# Patient Record
Sex: Female | Born: 1965 | Race: White | Hispanic: No | Marital: Married | Smoking: Never smoker
Health system: Southern US, Community
[De-identification: ages and names within clinical notes are randomized; demographics above are authoritative.]

---

## 2020-04-17 DIAGNOSIS — Z6830 Body mass index (BMI) 30.0-30.9, adult: Secondary | ICD-10-CM | POA: Diagnosis not present

## 2020-04-17 DIAGNOSIS — Z01419 Encounter for gynecological examination (general) (routine) without abnormal findings: Secondary | ICD-10-CM | POA: Diagnosis not present

## 2020-04-30 DIAGNOSIS — Z1231 Encounter for screening mammogram for malignant neoplasm of breast: Secondary | ICD-10-CM | POA: Diagnosis not present

## 2020-04-30 DIAGNOSIS — Z13820 Encounter for screening for osteoporosis: Secondary | ICD-10-CM | POA: Diagnosis not present

## 2020-05-05 ENCOUNTER — Ambulatory Visit
Admission: RE | Admit: 2020-05-05 | Discharge: 2020-05-05 | Disposition: A | Discharge status: Home / Self Care | Payer: BC Managed Care – PPO | Source: Ambulatory Visit | Attending: Emergency Medicine | Admitting: Emergency Medicine

## 2020-05-05 ENCOUNTER — Other Ambulatory Visit: Payer: Self-pay

## 2020-05-05 VITALS — BP 130/85 | HR 76 | Temp 98.9°F | Resp 18

## 2020-05-05 DIAGNOSIS — S61012A Laceration without foreign body of left thumb without damage to nail, initial encounter: Secondary | ICD-10-CM

## 2020-05-05 MED ORDER — CEPHALEXIN 500 MG PO CAPS: 500.0000 mg | ORAL_CAPSULE | Freq: Four times a day (QID) | ORAL | 0 refills | Status: DC

## 2020-05-05 NOTE — ED Triage Notes (Signed)
Pt presents with complaints of laceration to left thumb. States she was trying to get something out of her dogs bone with a knife and cut her finger. Bleeding controlled. Patient has piece of cotton stuck in the laceration. This RN got a large portion out running the finger under cold water. There is a small piece left that the patient cannot tolerate this RN trying to remove. Laceration occurred on Sunday.

## 2020-05-05 NOTE — ED Provider Notes (Signed)
[facility]   [id number] 05/05/20 Arrival Time: 1752  CC: LACERATION  SUBJECTIVE:  Julia Macias is a 54 y.o. female who presented to the urgent care for complaint of laceration to left tongue that occur 3 days ago.  Reports she cut her finger with a knife.  Bleeding controlled.  Currently not on blood thinners.  Denies similar symptoms in the past.  Denies fever, chills, nausea, vomiting, redness, swelling, purulent drainage, decrease strength or sensation.   Td UTD: Yes.  ROS: As per HPI.  All other pertinent ROS negative.     History reviewed. No pertinent past medical history. History reviewed. No pertinent surgical history. No Known Allergies No current facility-administered medications on file prior to encounter.   No current outpatient medications on file prior to encounter.   Social History   Socioeconomic History  . Marital status: Single    Spouse name: Not on file  . Number of children: Not on file  . Years of education: Not on file  . Highest education level: Not on file  Occupational History  . Not on file  Tobacco Use  . Smoking status: Never Smoker  . Smokeless tobacco: Never Used  Substance and Sexual Activity  . Alcohol use: Yes    Comment: occ  . Drug use: Not on file  . Sexual activity: Not on file  Other Topics Concern  . Not on file  Social History Narrative  . Not on file   Social Determinants of Health   Financial Resource Strain:   . Difficulty of Paying Living Expenses: Not on file  Food Insecurity:   . Worried About Programme researcher, broadcasting/film/video in the Last Year: Not on file  . Ran Out of Food in the Last Year: Not on file  Transportation Needs:   . Lack of Transportation (Medical): Not on file  . Lack of Transportation (Non-Medical): Not on file  Physical Activity:   . Days of Exercise per Week: Not on file  . Minutes of Exercise per Session: Not on file  Stress:   . Feeling of Stress : Not on file  Social Connections:   .  Frequency of Communication with Friends and Family: Not on file  . Frequency of Social Gatherings with Friends and Family: Not on file  . Attends Religious Services: Not on file  . Active Member of Clubs or Organizations: Not on file  . Attends Banker Meetings: Not on file  . Marital Status: Not on file  Intimate Partner Violence:   . Fear of Current or Ex-Partner: Not on file  . Emotionally Abused: Not on file  . Physically Abused: Not on file  . Sexually Abused: Not on file   Family History  Problem Relation Age of Onset  . Healthy Mother   . Healthy Father      OBJECTIVE:  Vitals:   05/05/20 1830  BP: 130/85  Pulse: 76  Resp: 18  Temp: 98.9 F (37.2 C)  SpO2: 98%     General appearance: alert; no distress Chest: CTA heart sounds normal Heart: RRR, no murmur, gallop or rub Skin: laceration of left thumb; size: approx U-shaped at the tip of left thumb Psychological: alert and cooperative; normal mood and affect   No results found for this or any previous visit.  Labs Reviewed - No data to display  No results found.   ASSESSMENT & PLAN:  1. Laceration of left thumb without damage to nail, foreign body presence unspecified, initial  encounter     Meds ordered this encounter  Medications  . cephALEXin (KEFLEX) 500 MG capsule    Sig: Take 1 capsule (500 mg total) by mouth 4 (four) times daily.    Dispense:  20 capsule    Refill:  0   Discharge Instructions  Clean with warm water and mild soap.  Avoid submerging wound in water. Take antibiotics prescribed and to completion Change dressing daily and apply a thin layer of neosporin. .   Take OTC ibuprofen or tylenol as needed for pain releif Return sooner or go to the ED if you have any new or worsening symptoms such as increased pain, redness, swelling, drainage, discharge, decreased range of motion of extremity, etc..     Reviewed expectations re: course of current medical issues. Questions  answered. Outlined signs and symptoms indicating need for more acute intervention. Patient verbalized understanding. After Visit Summary given.   Note: This document was prepared using Dragon voice recognition software and may include unintentional dictation errors.    Durward Parcel, FNP 05/05/20 1906

## 2020-05-05 NOTE — Discharge Instructions (Signed)
°  Clean with warm water and mild soap.  Avoid submerging wound in water. Take antibiotics prescribed and to completion Change dressing daily and apply a thin layer of neosporin. .   Take OTC ibuprofen or tylenol as needed for pain releif Return sooner or go to the ED if you have any new or worsening symptoms such as increased pain, redness, swelling, drainage, discharge, decreased range of motion of extremity, etc..

## 2020-05-25 ENCOUNTER — Other Ambulatory Visit: Payer: Self-pay | Admitting: Obstetrics and Gynecology

## 2020-05-25 DIAGNOSIS — Z9189 Other specified personal risk factors, not elsewhere classified: Secondary | ICD-10-CM

## 2020-06-10 DIAGNOSIS — L718 Other rosacea: Secondary | ICD-10-CM | POA: Diagnosis not present

## 2020-06-10 DIAGNOSIS — D1801 Hemangioma of skin and subcutaneous tissue: Secondary | ICD-10-CM | POA: Diagnosis not present

## 2020-06-10 DIAGNOSIS — L821 Other seborrheic keratosis: Secondary | ICD-10-CM | POA: Diagnosis not present

## 2020-06-10 DIAGNOSIS — L814 Other melanin hyperpigmentation: Secondary | ICD-10-CM | POA: Diagnosis not present

## 2020-06-17 ENCOUNTER — Other Ambulatory Visit: Payer: Self-pay

## 2020-06-17 ENCOUNTER — Ambulatory Visit
Admission: RE | Admit: 2020-06-17 | Discharge: 2020-06-17 | Disposition: A | Discharge status: Home / Self Care | Payer: BC Managed Care – PPO | Source: Ambulatory Visit | Attending: Obstetrics and Gynecology | Admitting: Obstetrics and Gynecology

## 2020-06-17 DIAGNOSIS — N6489 Other specified disorders of breast: Secondary | ICD-10-CM | POA: Diagnosis not present

## 2020-06-17 DIAGNOSIS — Z9189 Other specified personal risk factors, not elsewhere classified: Secondary | ICD-10-CM

## 2020-06-17 IMAGING — MR MR BREAST BILAT WO/W CM
8 of 12 series · 33 of 48 positions shown · IV contrast (9 ml gadavist)
Comparison: [DATE] mammogram and prior studies

CLINICAL DATA: 53-year-old female with strong family history of
breast cancer for screening breast MRI.

LABS:  None performed today
EXAM:
BILATERAL BREAST MRI WITH AND WITHOUT CONTRAST
TECHNIQUE: Multiplanar, multisequence MR images of both breasts were obtained
prior to and following the intravenous administration of 9 ml of
Gadavist

[Series 2: t2_tirm_tra ipat (a-p) · axial · 3.0mm · 0.70mm/px · 1 of 55 slices shown]
[im 1/55]
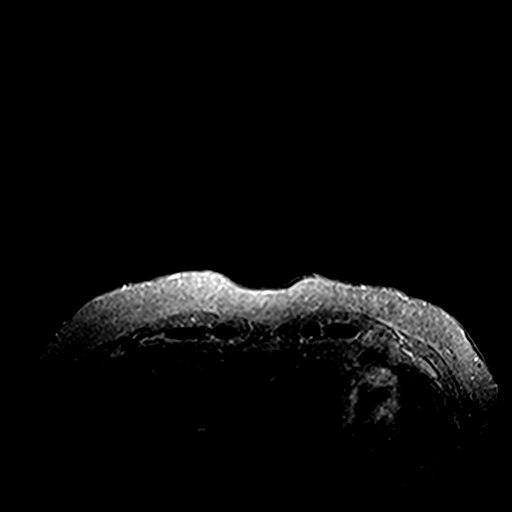

[Series 3: fl3d pre-cm no · axial · non-contrast · 1.2mm · 0.94mm/px · z∈[-59,+113]mm · 5 of 144 slices shown]
[im 1/144]
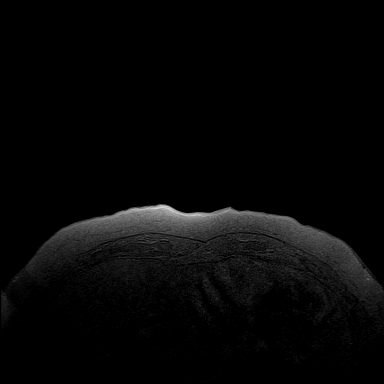
[im 36/144]
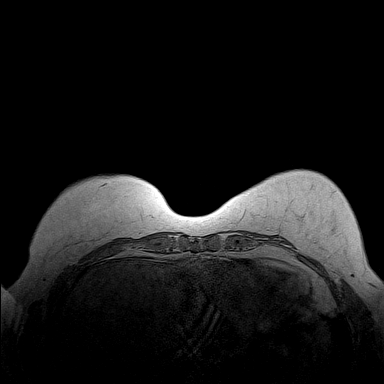
[im 72/144]
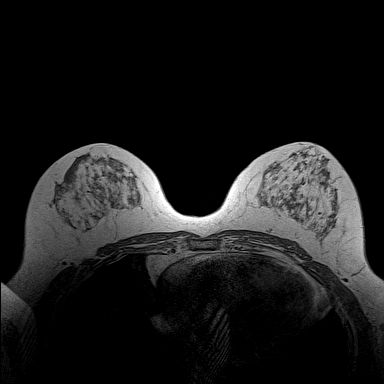
[im 108/144]
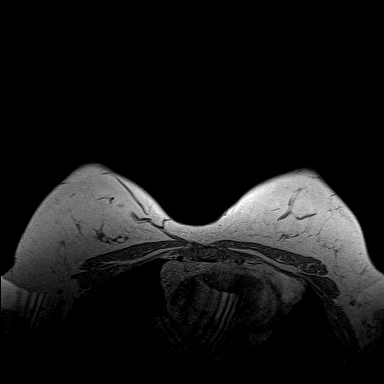
[im 144/144]
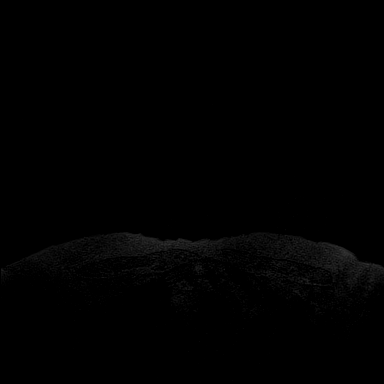

[Series 4: fl3d pre-cm · axial · non-contrast · 1.2mm · 0.94mm/px · z∈[-59,+113]mm · 5 of 144 slices shown]
[im 1/144]
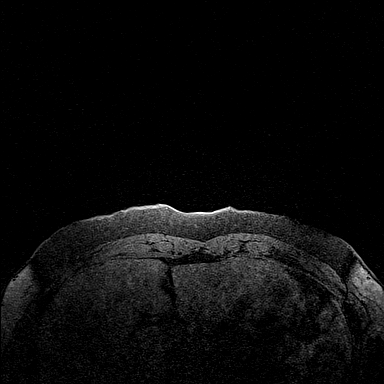
[im 36/144]
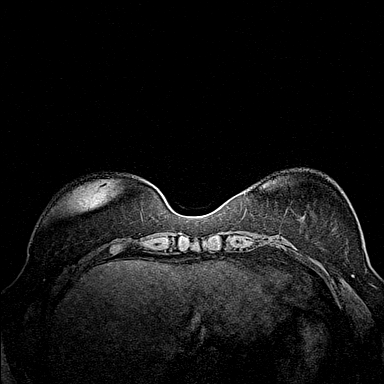
[im 72/144]
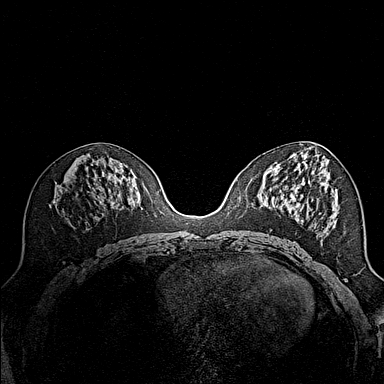
[im 108/144]
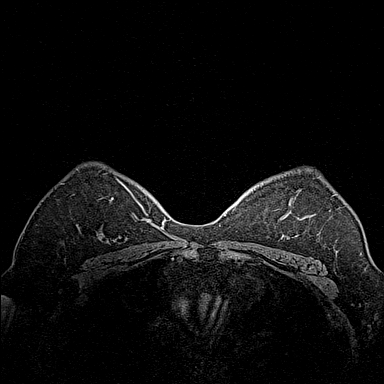
[im 144/144]
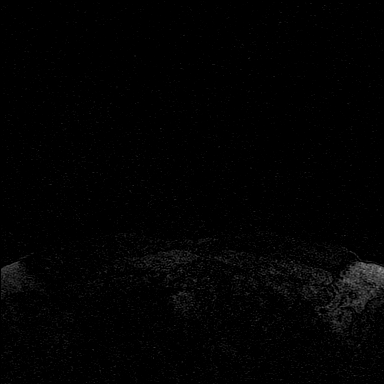

[Series 5: fl3d post-cm 20 · axial · 1.2mm · 0.94mm/px · z∈[-59,+113]mm · 5 of 144 slices shown (1 of 3)]
[im 1/144]
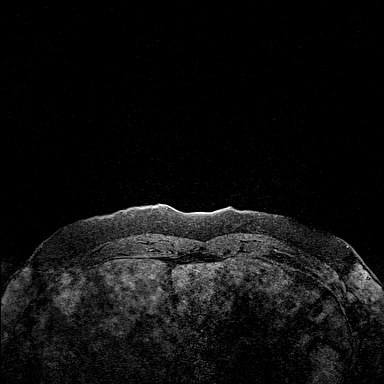
[im 36/144]
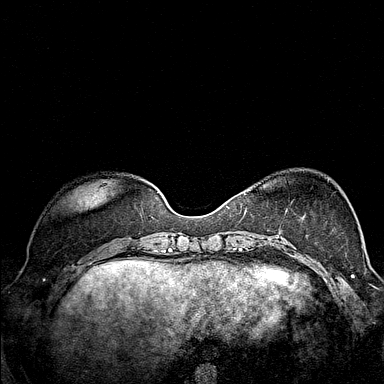
[im 72/144]
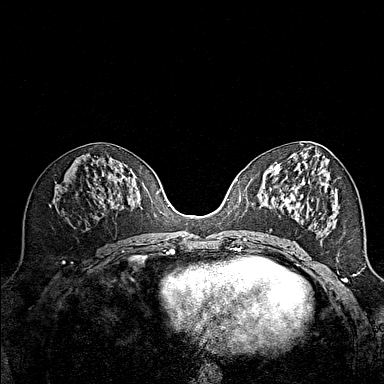
[im 108/144]
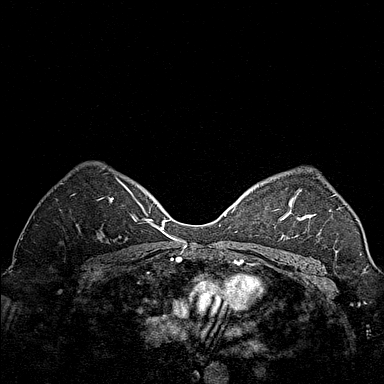
[im 144/144]
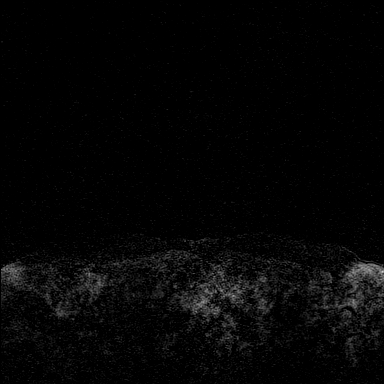

[Series 6: fl3d post-cm 20 · axial · 1.2mm · 0.94mm/px · z∈[-59,+113]mm · 5 of 144 slices shown (2 of 3)]
[im 1/144]
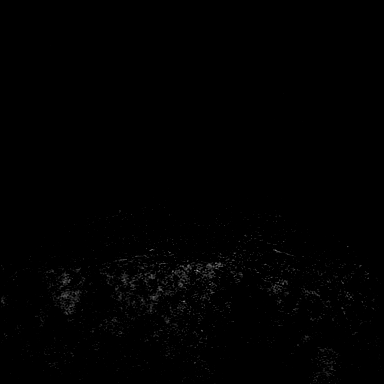
[im 36/144]
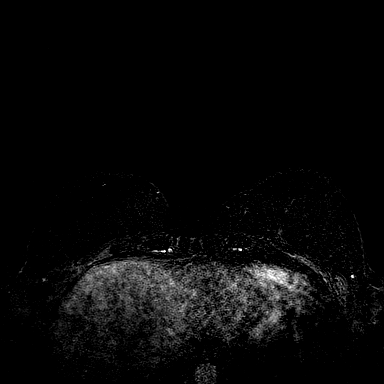
[im 72/144]
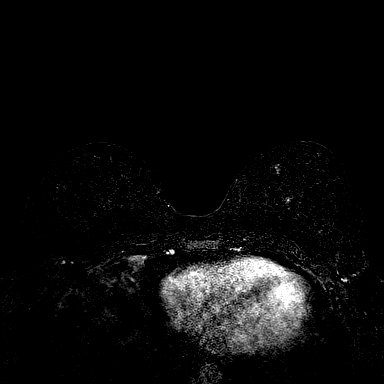
[im 108/144]
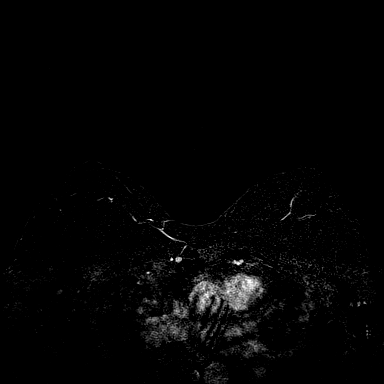
[im 144/144]
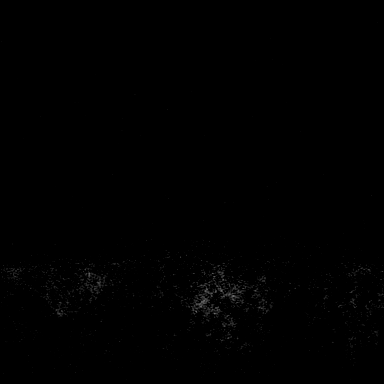

[Series 7: fl3d post-cm 20 · axial · 172.8mm · 0.94mm/px · 1 of 1 slices shown (3 of 3)]
[im 1/1]
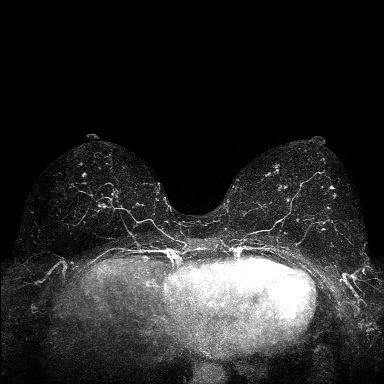

[Series 8: fl3d post-cm 3min · axial · 1.2mm · 0.94mm/px · z∈[-59,+113]mm · 6 of 144 slices shown]
[im 1/144]
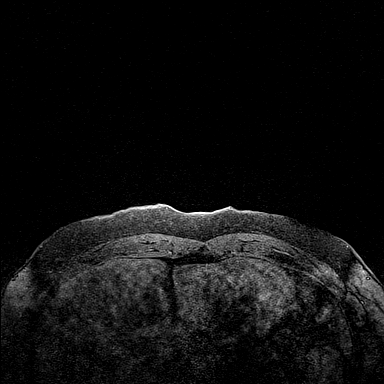
[im 29/144]
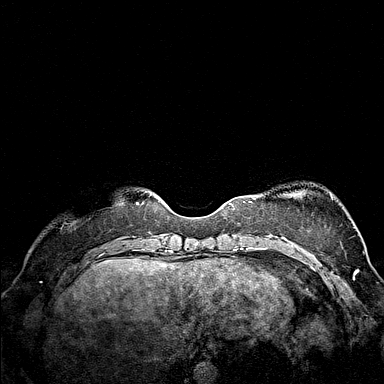
[im 58/144]
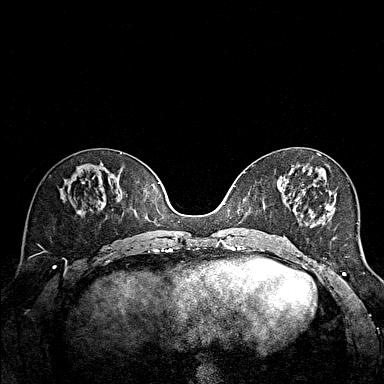
[im 86/144]
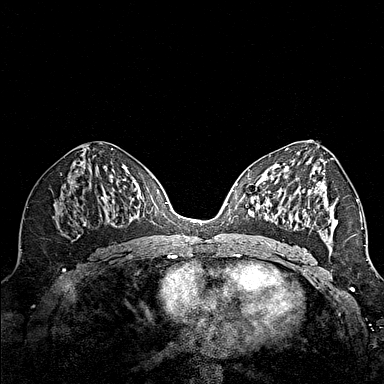
[im 115/144]
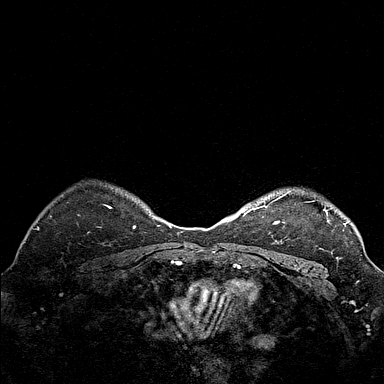
[im 144/144]
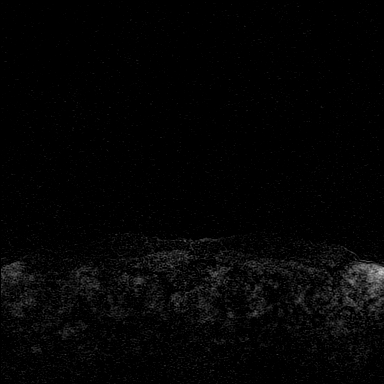

[Series 9: fl3d post-cm 3min_sub · axial · 1.2mm · 0.94mm/px · z∈[-59,+78]mm · 5 of 144 slices shown]
[im 1/144]
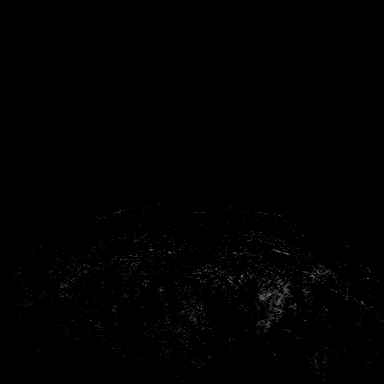
[im 29/144]
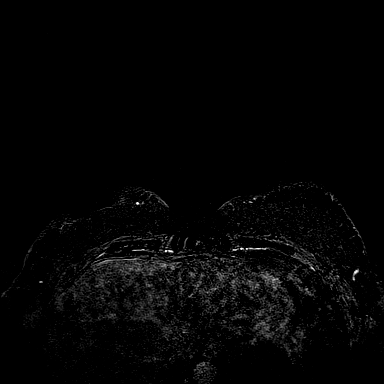
[im 58/144]
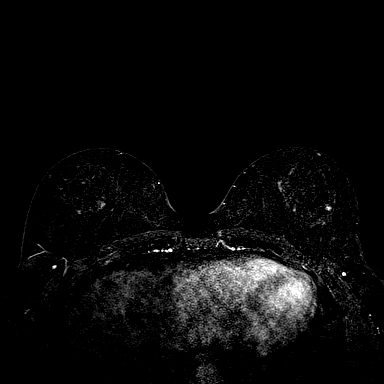
[im 86/144]
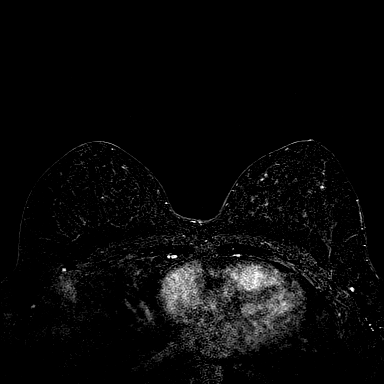
[im 115/144]
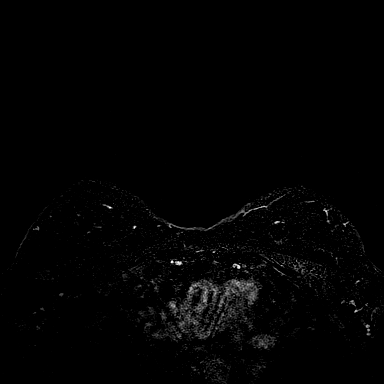

[33 of 48 positions shown; findings below may reference images not displayed]

Three-dimensional MR images were rendered by post-processing of the
original MR data on an independent workstation. The
three-dimensional MR images were interpreted, and findings are
reported in the following complete MRI report for this study. Three
dimensional images were evaluated at the independent interpreting
workstation using the DynaCAD thin client.
FINDINGS: Breast composition: c. Heterogeneous fibroglandular tissue.

Background parenchymal enhancement: Mild

Right breast: No mass or abnormal enhancement. Scattered similar
appearing foci within the RIGHT breast noted.

Left breast: No mass or abnormal enhancement. Scattered similar
appearing foci within the LEFT breast noted.

Lymph nodes: No abnormal appearing lymph nodes.

Ancillary findings:  None.
IMPRESSION: No MR evidence of breast malignancy.

RECOMMENDATION:
Bilateral screening breast MRI in 1 year as clinically indicated.

Bilateral screening mammogram in [DATE] to resume annual
mammogram schedule.

BI-RADS CATEGORY  1: Negative.

## 2020-06-17 MED ORDER — GADOBUTROL 1 MMOL/ML IV SOLN
9.0000 mL | Freq: Once | INTRAVENOUS | Status: AC | PRN
  Administered 2020-06-17: 9 mL via INTRAVENOUS

## 2020-09-15 ENCOUNTER — Other Ambulatory Visit: Payer: Self-pay | Admitting: Obstetrics and Gynecology

## 2020-09-15 DIAGNOSIS — N6320 Unspecified lump in the left breast, unspecified quadrant: Secondary | ICD-10-CM

## 2020-10-02 ENCOUNTER — Ambulatory Visit
Admission: RE | Admit: 2020-10-02 | Discharge: 2020-10-02 | Disposition: A | Discharge status: Home / Self Care | Payer: BC Managed Care – PPO | Source: Ambulatory Visit | Attending: Obstetrics and Gynecology | Admitting: Obstetrics and Gynecology

## 2020-10-02 ENCOUNTER — Other Ambulatory Visit: Payer: Self-pay

## 2020-10-02 DIAGNOSIS — N6320 Unspecified lump in the left breast, unspecified quadrant: Secondary | ICD-10-CM

## 2020-10-02 DIAGNOSIS — R922 Inconclusive mammogram: Secondary | ICD-10-CM | POA: Diagnosis not present

## 2020-10-02 DIAGNOSIS — N6489 Other specified disorders of breast: Secondary | ICD-10-CM | POA: Diagnosis not present

## 2020-10-02 IMAGING — US US BREAST*L* LIMITED INC AXILLA
1 series · 2 of 2 positions shown · non-contrast
Comparison: Previous exam(s).

CLINICAL DATA: 54-year-old with a palpable lump and discoloration
involving the LOWER LEFT breast which was initially noted a couple
of months ago, though resolved approximately 2 weeks ago. There is
no persistent palpable concern at this time.

EXAM:
DIGITAL DIAGNOSTIC LEFT MAMMOGRAM WITH CAD AND TOMOSYNTHESIS
ULTRASOUND LEFT BREAST
TECHNIQUE: LEFT digital diagnostic mammography and breast tomosynthesis was
performed. Digital images of the breast were evaluated with
computer-aided detection. Targeted ultrasound examination of the
LEFT breast was performed.

[Series 1: us breast*left* limited inc axilla · 0.07mm/px · 2 of 2 slices shown]
[im 1/2]
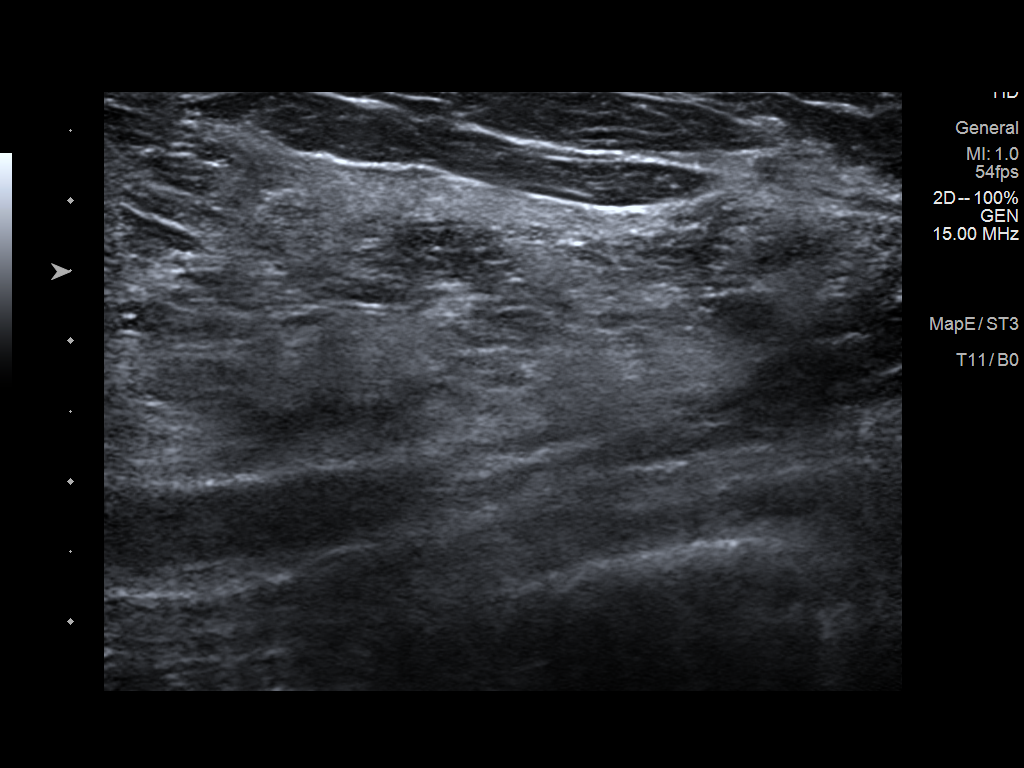
[im 2/2]
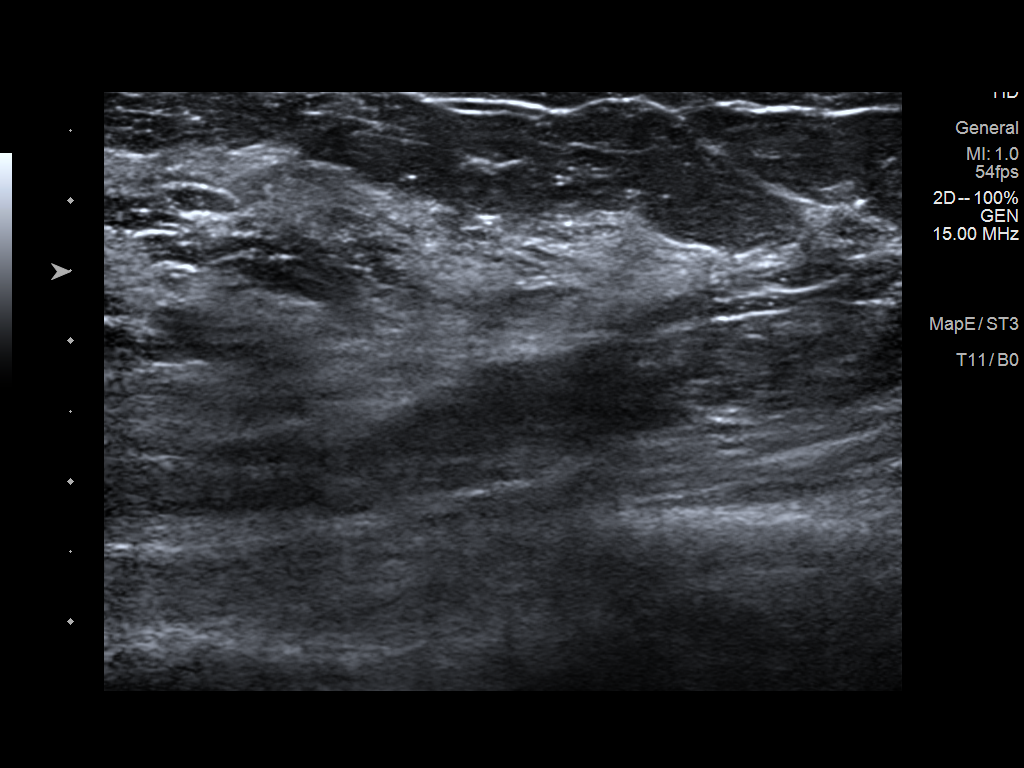

[2 of 2 positions shown; findings below may reference images not displayed]

ACR Breast Density Category c: The breast tissue is heterogeneously
dense, which may obscure small masses.
FINDINGS: Tomosynthesis and synthesized full field CC and MLO views and a
tomosynthesis and synthesized spot compression tangential view of
the area of concern were obtained.

No mammographic abnormality in the area of concern in the LOWER
breast. Normal dense fibroglandular tissue is present in this
location.

No findings suspicious for malignancy in either breast.

Targeted ultrasound is performed, showing normal dense
fibroglandular tissue at the 5-6 o'clock position approximately 3 cm
from the nipple in the area of prior palpable concern and skin
discoloration. No cyst, solid mass or abnormal acoustic shadowing is
identified.

On correlative physical exam, there is no palpable abnormality in
the LOWER LEFT breast in the area of prior concern and there is no
persistent skin discoloration.
IMPRESSION: No mammographic or sonographic evidence of malignancy involving the
LEFT breast.

RECOMMENDATION:
Annual BILATERAL screening mammography which is due in [DATE].

I have discussed the findings and recommendations with the patient.
If applicable, a reminder letter will be sent to the patient
regarding the next appointment.

BI-RADS CATEGORY  1: Negative.

## 2020-10-02 IMAGING — MG MM DIGITAL DIAGNOSTIC UNILAT*L* W/ TOMO W/ CAD
6 series · 6 of 18 positions shown · non-contrast
Comparison: Previous exam(s).

CLINICAL DATA: 54-year-old with a palpable lump and discoloration
involving the LOWER LEFT breast which was initially noted a couple
of months ago, though resolved approximately 2 weeks ago. There is
no persistent palpable concern at this time.

EXAM:
DIGITAL DIAGNOSTIC LEFT MAMMOGRAM WITH CAD AND TOMOSYNTHESIS
ULTRASOUND LEFT BREAST
TECHNIQUE: LEFT digital diagnostic mammography and breast tomosynthesis was
performed. Digital images of the breast were evaluated with
computer-aided detection. Targeted ultrasound examination of the
LEFT breast was performed.

[L MLO synth-2D (1 of 2)]
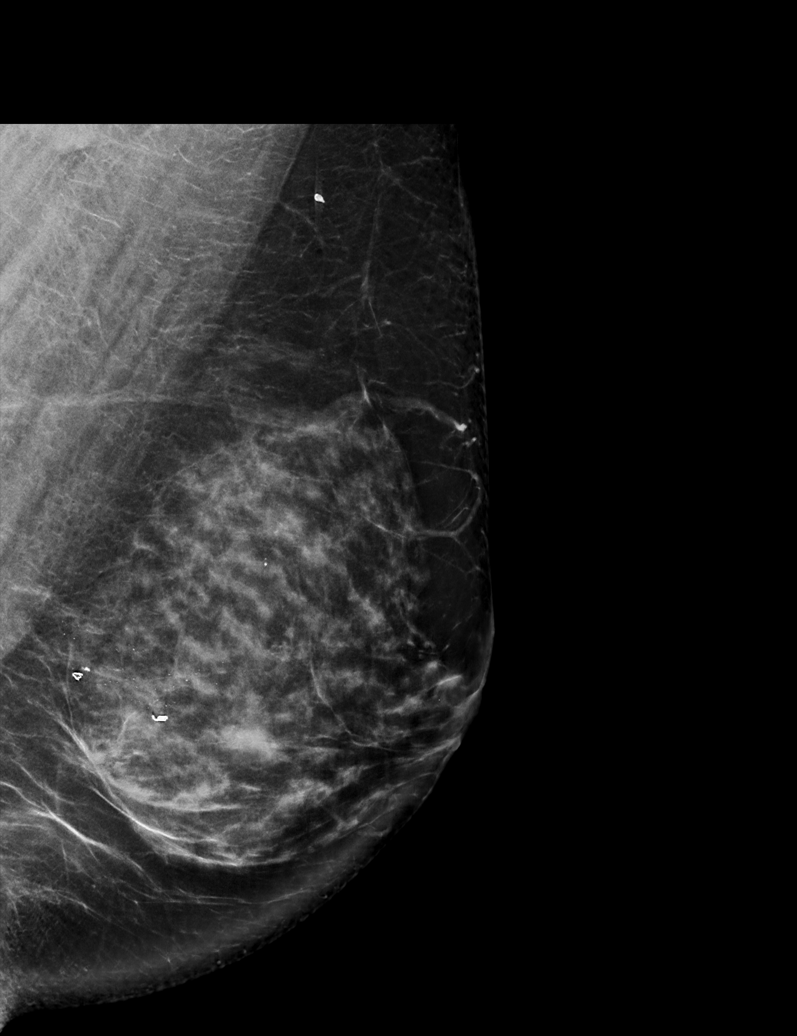

[L MLO synth-2D (2 of 2)]
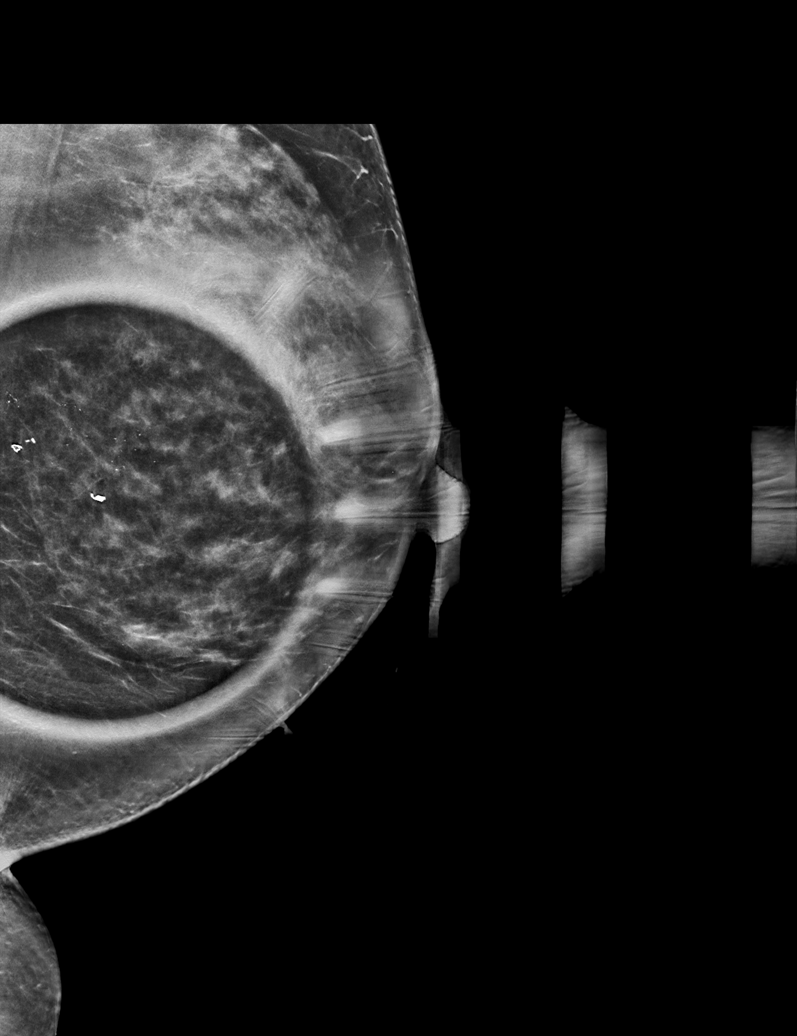

[L CC synth-2D]
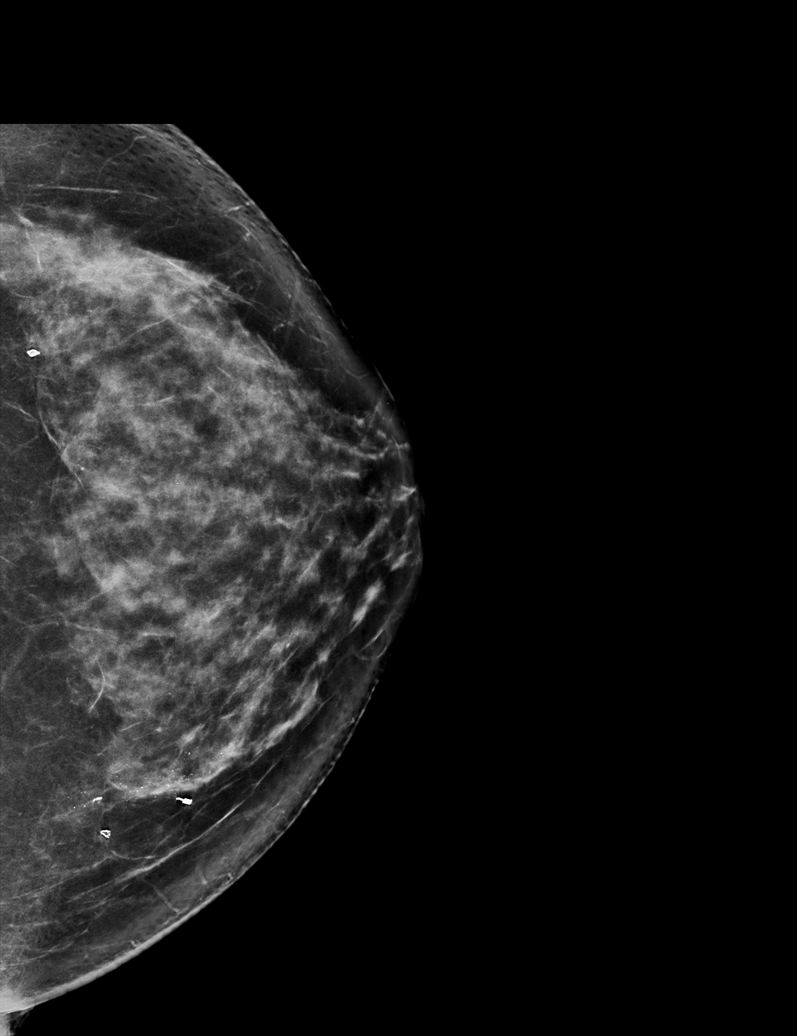

[L CC tomo · tomo slice 43/86.0]
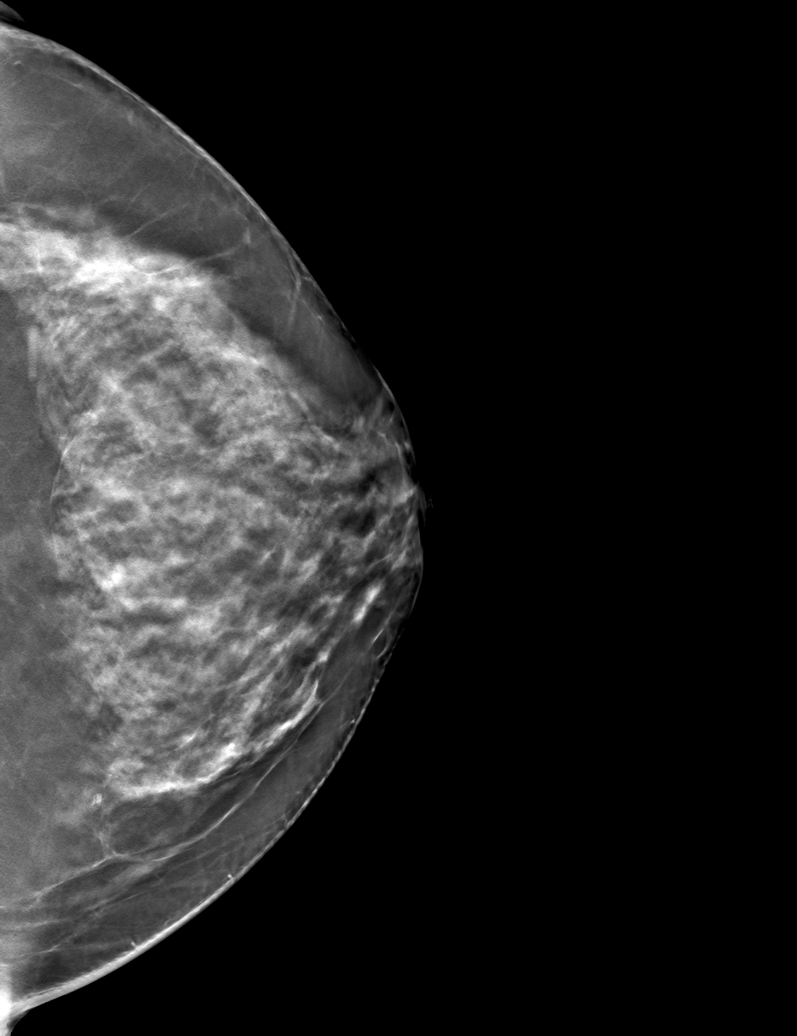

[L MLO tomo (1 of 2) · tomo slice 46/91.0]
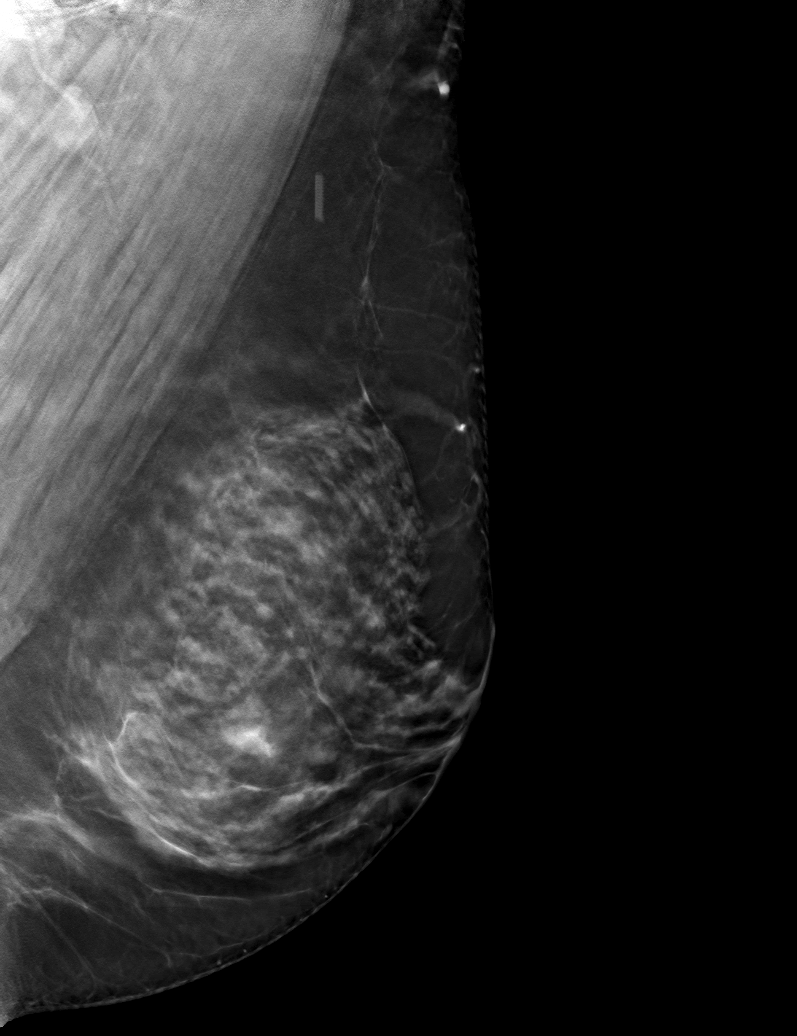

[L MLO tomo (2 of 2) · tomo slice 36/71.0]
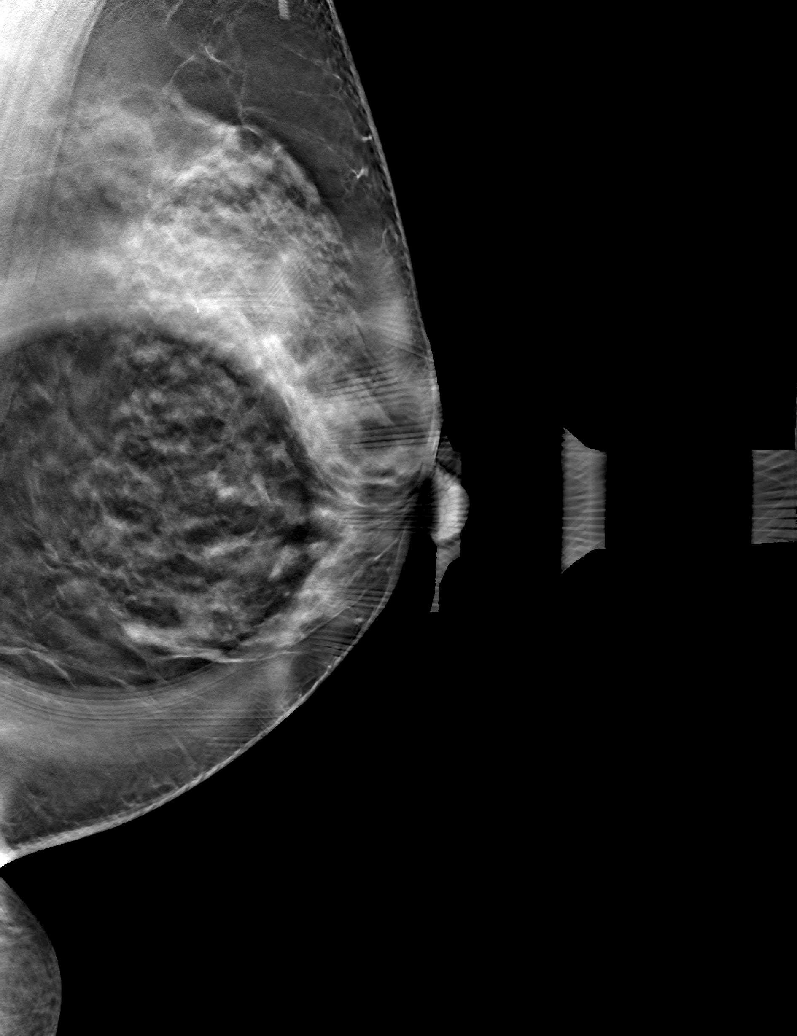

[6 of 18 positions shown; findings below may reference images not displayed]

ACR Breast Density Category c: The breast tissue is heterogeneously
dense, which may obscure small masses.
FINDINGS: Tomosynthesis and synthesized full field CC and MLO views and a
tomosynthesis and synthesized spot compression tangential view of
the area of concern were obtained.

No mammographic abnormality in the area of concern in the LOWER
breast. Normal dense fibroglandular tissue is present in this
location.

No findings suspicious for malignancy in either breast.

Targeted ultrasound is performed, showing normal dense
fibroglandular tissue at the 5-6 o'clock position approximately 3 cm
from the nipple in the area of prior palpable concern and skin
discoloration. No cyst, solid mass or abnormal acoustic shadowing is
identified.

On correlative physical exam, there is no palpable abnormality in
the LOWER LEFT breast in the area of prior concern and there is no
persistent skin discoloration.
IMPRESSION: No mammographic or sonographic evidence of malignancy involving the
LEFT breast.

RECOMMENDATION:
Annual BILATERAL screening mammography which is due in [DATE].

I have discussed the findings and recommendations with the patient.
If applicable, a reminder letter will be sent to the patient
regarding the next appointment.

BI-RADS CATEGORY  1: Negative.

## 2021-01-25 ENCOUNTER — Encounter: Payer: Self-pay | Admitting: Emergency Medicine

## 2021-01-25 ENCOUNTER — Telehealth: Payer: BC Managed Care – PPO | Admitting: Emergency Medicine

## 2021-01-25 DIAGNOSIS — S40861A Insect bite (nonvenomous) of right upper arm, initial encounter: Secondary | ICD-10-CM

## 2021-01-25 DIAGNOSIS — W57XXXA Bitten or stung by nonvenomous insect and other nonvenomous arthropods, initial encounter: Secondary | ICD-10-CM | POA: Diagnosis not present

## 2021-01-25 DIAGNOSIS — L089 Local infection of the skin and subcutaneous tissue, unspecified: Secondary | ICD-10-CM | POA: Diagnosis not present

## 2021-01-25 MED ORDER — CEPHALEXIN 500 MG PO CAPS: 500.0000 mg | ORAL_CAPSULE | Freq: Two times a day (BID) | ORAL | 0 refills | Status: AC

## 2021-01-25 MED ORDER — TRIAMCINOLONE ACETONIDE 0.1 % EX CREA: 1.0000 "application " | TOPICAL_CREAM | Freq: Three times a day (TID) | CUTANEOUS | 0 refills | Status: DC | PRN

## 2021-01-25 NOTE — Progress Notes (Signed)
Ms. Julia Macias, Julia Macias are scheduled for a virtual visit with your provider today.    Just as we do with appointments in the office, we must obtain your consent to participate.  Your consent will be active for this visit and any virtual visit you may have with one of our providers in the next 365 days.    If you have a MyChart account, I can also send a copy of this consent to you electronically.  All virtual visits are billed to your insurance company just like a traditional visit in the office.  As this is a virtual visit, video technology does not allow for your provider to perform a traditional examination.  This may limit your provider's ability to fully assess your condition.  If your provider identifies any concerns that need to be evaluated in person or the need to arrange testing such as labs, EKG, etc, we will make arrangements to do so.    Although advances in technology are sophisticated, we cannot ensure that it will always work on either your end or our end.  If the connection with a video visit is poor, we may have to switch to a telephone visit.  With either a video or telephone visit, we are not always able to ensure that we have a secure connection.   I need to obtain your verbal consent now.   Are you willing to proceed with your visit today?   Toshia Larkin has provided verbal consent on 01/25/2021 for a virtual visit (video or telephone).   Lurene Shadow, PA-C 01/25/2021  8:17 AM   Date:  01/25/2021   ID:  Julia Macias, DOB 20-Jan-1966, MRN [id number]  Patient Location: Home Provider Location: Home Office   Participants: Patient and Provider for Visit and Wrap up  Method of visit: Video  Location of Patient: Home Location of Provider: Home Office Consent was obtain for visit over the video. Services rendered by provider: Visit was performed via video  A video enabled telemedicine application was used and I verified that I am speaking with the correct person using  two identifiers.  PCP:  Patient, No Pcp Per (Inactive)   Chief Complaint:  Insect bites, itching  History of Present Illness:    Julia Macias is a 55 y.o. female with history as stated below. Presents video telehealth for an acute care visit  Onset of symptoms was 5-6 days ago and symptoms have been persistent and include: redness, itching, swelling and soreness on Right upper arm.  Noticed two small suspected insect bites on arm shortly after trimming some bushes.   Denies having fevers, chills, shortness of breath, nausea or vomiting. No sore throat or oral swelling. No other rashes.  Modifying factors include: OTC hydrocortisone cream and benadryl cream with mild temporary relief.   No other aggravating or relieving factors.  No other c/o.  Past Medical, Surgical, Social History, Allergies, and Medications have been Reviewed.  There are no problems to display for this patient.   Social History   Tobacco Use  . Smoking status: Never Smoker  . Smokeless tobacco: Never Used  Substance Use Topics  . Alcohol use: Yes    Comment: occ     Current Outpatient Medications:  .  cephALEXin (KEFLEX) 500 MG capsule, Take 1 capsule (500 mg total) by mouth 2 (two) times daily for 7 days., Disp: 14 capsule, Rfl: 0 .  triamcinolone cream (KENALOG) 0.1 %, Apply 1 application topically 3 (three) times daily as needed., Disp: 30  g, Rfl: 0   No Known Allergies   ROS See HPI for history of present illness.  Physical Exam Constitutional:      General: She is not in acute distress.    Appearance: Normal appearance. She is not ill-appearing.  HENT:     Head: Normocephalic.     Nose: Nose normal.     Mouth/Throat:     Comments: No visible oral swelling Pulmonary:     Effort: Pulmonary effort is normal. No respiratory distress.  Skin:    Findings: Erythema present.          Comments: Right upper arm: two small erythematous papules. One with surrounding faint erythema. Per  patient palpation- induration and mild tenderness around the proximal papule  Neurological:     Mental Status: She is alert and oriented to person, place, and time.               A&P  1. Insect bite of upper arm, infected, Right, initial encounter.  -cephalexin 500mg  twice daily for 7 days  -triamcinolone cream 0.1% apply 3 times daily as needed for itching  -discussed warm compresses  -follow up in 3-4 days if not improving, sooner if worsening.    Patient voiced understanding and agreement to plan.   Time:   Today, I have spent 15 minutes with the patient with telehealth technology discussing the above problems, reviewing the chart, previous notes, medications and orders.    Tests Ordered: No orders of the defined types were placed in this encounter.   Medication Changes: Meds ordered this encounter  Medications  . cephALEXin (KEFLEX) 500 MG capsule    Sig: Take 1 capsule (500 mg total) by mouth 2 (two) times daily for 7 days.    Dispense:  14 capsule    Refill:  0  . triamcinolone cream (KENALOG) 0.1 %    Sig: Apply 1 application topically 3 (three) times daily as needed.    Dispense:  30 g    Refill:  0     Disposition:  Follow up with PCP or urgent care in 3-4 days if not improving, sooner if worsening.   , PA-C  01/25/2021 8:17 AM

## 2021-04-05 ENCOUNTER — Encounter (HOSPITAL_COMMUNITY): Payer: Self-pay | Admitting: *Deleted

## 2021-04-05 ENCOUNTER — Emergency Department (HOSPITAL_COMMUNITY)
Admission: EM | Admit: 2021-04-05 | Discharge: 2021-04-05 | Disposition: A | Discharge status: Home / Self Care | Payer: BC Managed Care – PPO | Attending: Emergency Medicine | Admitting: Emergency Medicine

## 2021-04-05 ENCOUNTER — Other Ambulatory Visit: Payer: Self-pay

## 2021-04-05 ENCOUNTER — Emergency Department (HOSPITAL_COMMUNITY): Payer: BC Managed Care – PPO

## 2021-04-05 DIAGNOSIS — R4701 Aphasia: Secondary | ICD-10-CM | POA: Insufficient documentation

## 2021-04-05 DIAGNOSIS — R0902 Hypoxemia: Secondary | ICD-10-CM | POA: Diagnosis not present

## 2021-04-05 DIAGNOSIS — I6789 Other cerebrovascular disease: Secondary | ICD-10-CM | POA: Diagnosis not present

## 2021-04-05 DIAGNOSIS — N9489 Other specified conditions associated with female genital organs and menstrual cycle: Secondary | ICD-10-CM | POA: Insufficient documentation

## 2021-04-05 DIAGNOSIS — R569 Unspecified convulsions: Secondary | ICD-10-CM

## 2021-04-05 DIAGNOSIS — R410 Disorientation, unspecified: Secondary | ICD-10-CM | POA: Diagnosis not present

## 2021-04-05 LAB — BASIC METABOLIC PANEL
Anion gap: 9 (ref 5–15)
BUN: 16 mg/dL (ref 6–20)
CO2: 24 mmol/L (ref 22–32)
Calcium: 9.6 mg/dL (ref 8.9–10.3)
Chloride: 103 mmol/L (ref 98–111)
Creatinine, Ser: 0.97 mg/dL (ref 0.44–1.00)
GFR, Estimated: >60 mL/min (ref >60)
Glucose, Bld: 131 mg/dL — ABNORMAL HIGH (ref 70–99)
Potassium: 3.8 mmol/L (ref 3.5–5.1)
Sodium: 136 mmol/L (ref 135–145)

## 2021-04-05 LAB — I-STAT BETA HCG BLOOD, ED (MC, WL, AP ONLY): I-stat hCG, quantitative: <5 m[IU]/mL (ref <5)

## 2021-04-05 LAB — CBG MONITORING, ED: Glucose-Capillary: 120 mg/dL — ABNORMAL HIGH (ref 70–99)

## 2021-04-05 LAB — CBC
HCT: 38.1 % (ref 36.0–46.0)
Hemoglobin: 12.3 g/dL (ref 12.0–15.0)
MCH: 30 pg (ref 26.0–34.0)
MCHC: 32.3 g/dL (ref 30.0–36.0)
MCV: 92.9 fL (ref 80.0–100.0)
Platelets: 351 10*3/uL (ref 150–400)
RBC: 4.1 MIL/uL (ref 3.87–5.11)
RDW: 13.1 % (ref 11.5–15.5)
WBC: 8.3 10*3/uL (ref 4.0–10.5)
nRBC: 0 % (ref 0.0–0.2)

## 2021-04-05 IMAGING — CT CT HEAD W/O CM
3 series · 14 of 47 positions shown, 16 images · non-contrast
Comparison: None.

CLINICAL DATA: New onset seizures last night.

EXAM:
CT HEAD WITHOUT CONTRAST
TECHNIQUE: Contiguous axial images were obtained from the base of the skull
through the vertex without intravenous contrast.

[Series 3: head 5.0 h30s · axial · 0.50mm/px · z∈[-174,-34]mm · 8 of 34 slices shown, 10 images]
[im 3/34  brain]
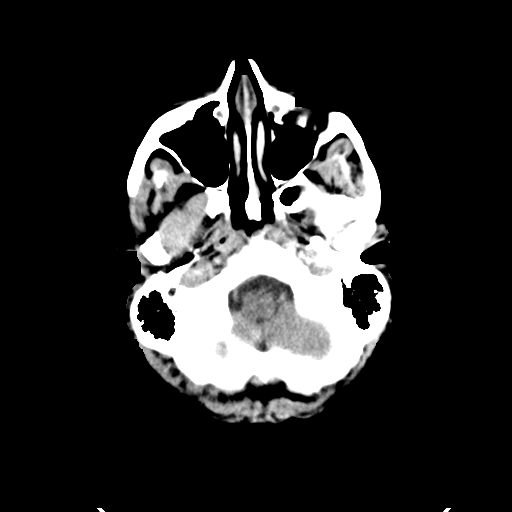
[im 3/34  bone]
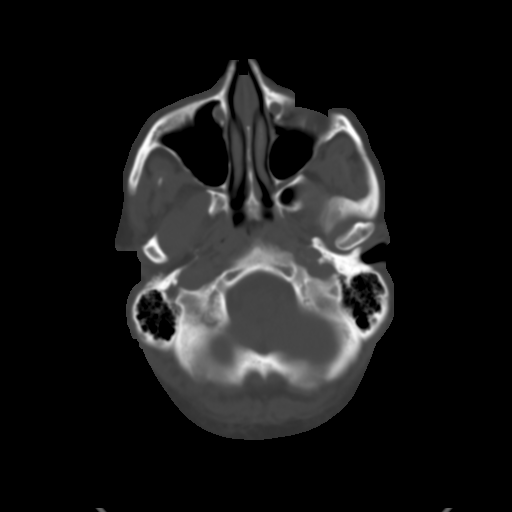
[im 7/34  brain]
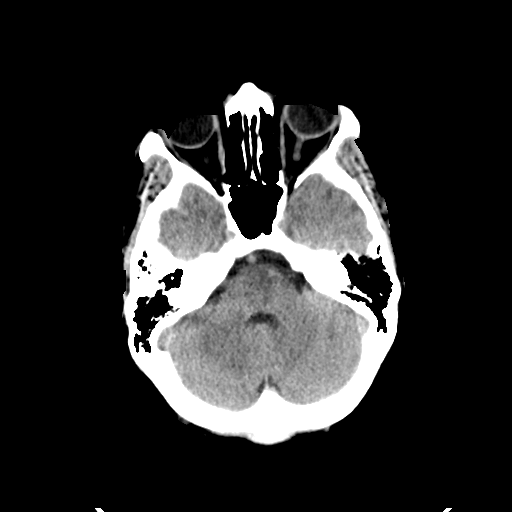
[im 11/34  brain]
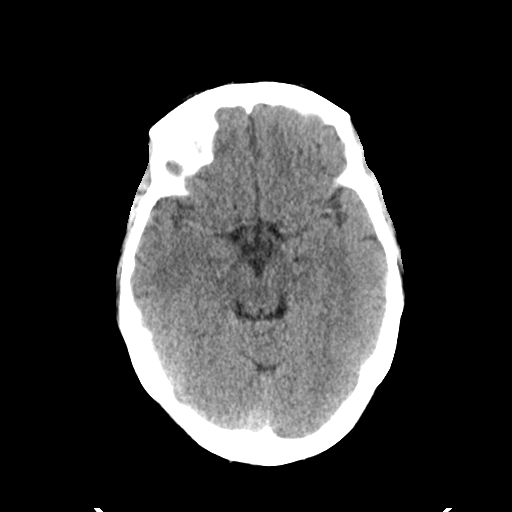
[im 15/34  brain]
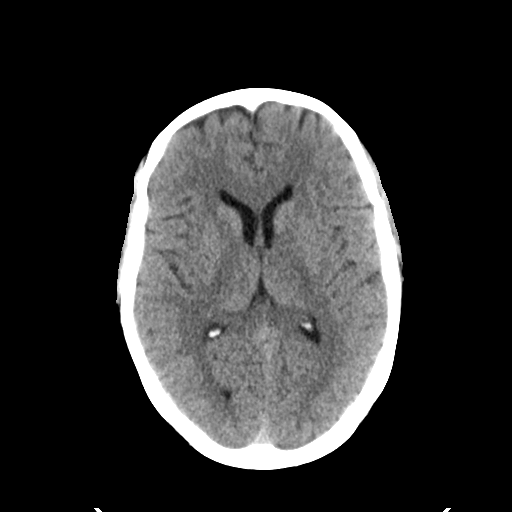
[im 19/34  brain]
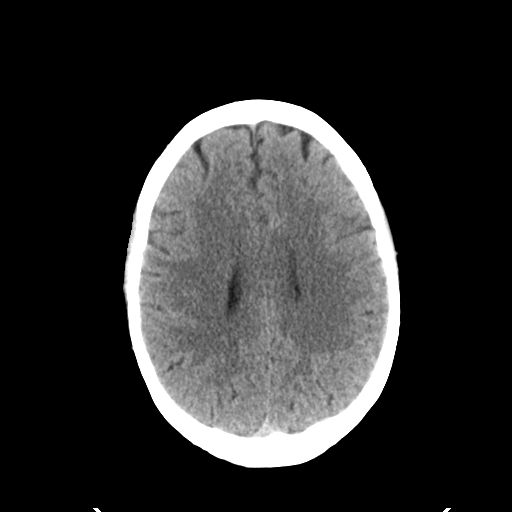
[im 19/34  bone]
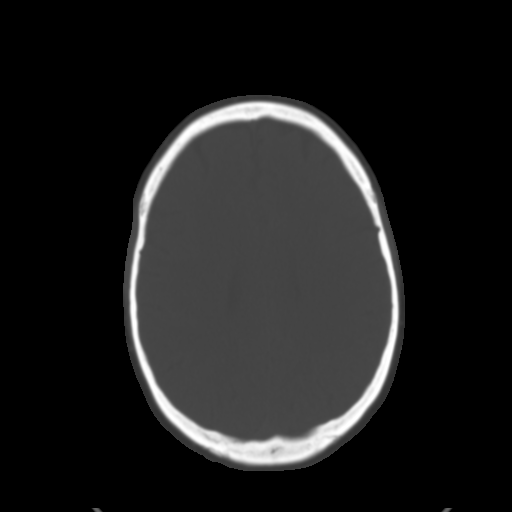
[im 23/34  brain]
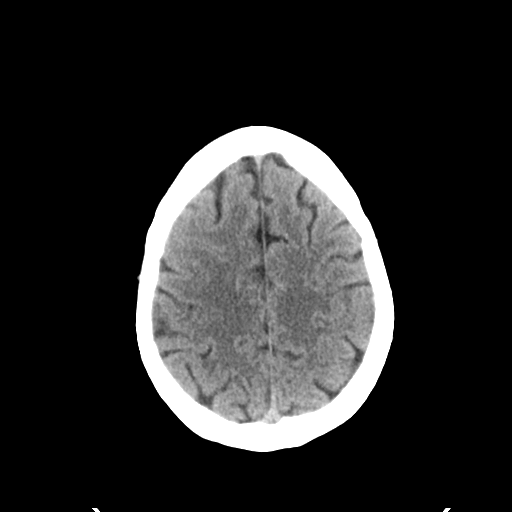
[im 27/34  brain]
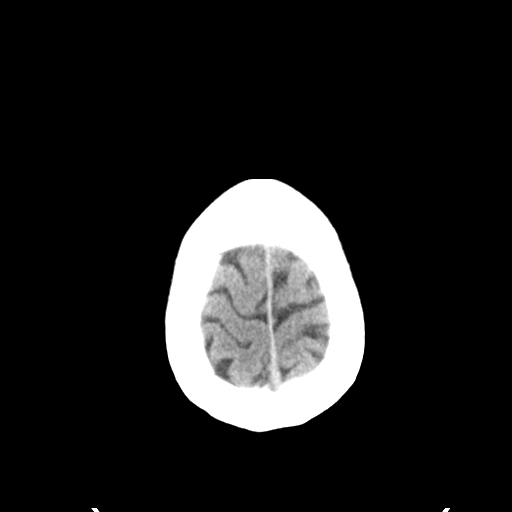
[im 31/34  brain]
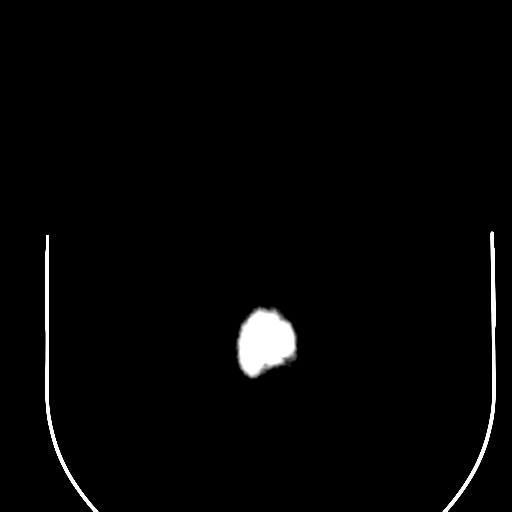

[Series 5: head 3.0 mpr cor · coronal · 0.33mm/px · 3 of 73 slices shown]
[im 25/73  brain]
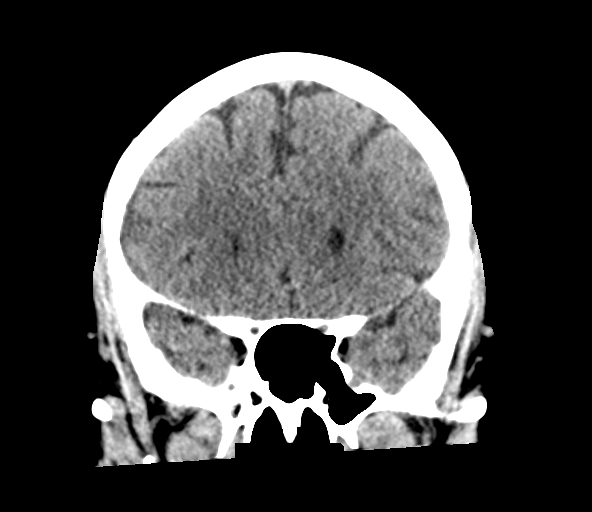
[im 33/73  brain]
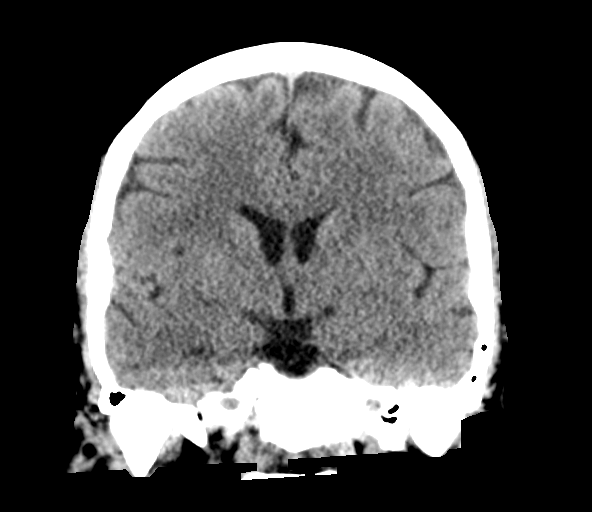
[im 41/73  brain]
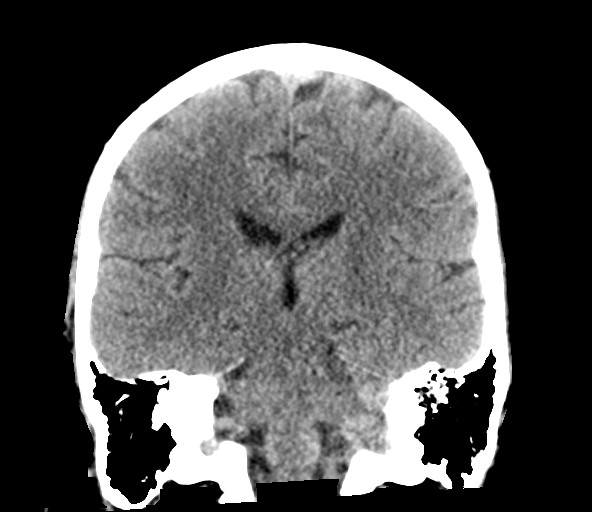

[Series 6: head 3.0 mpr sag · sagittal · 0.32mm/px · 3 of 67 slices shown]
[im 23/67  brain]
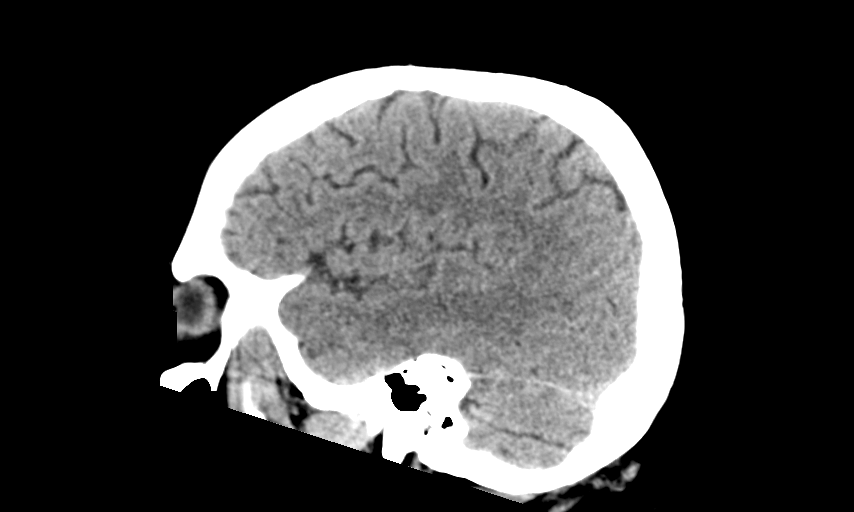
[im 34/67  brain]
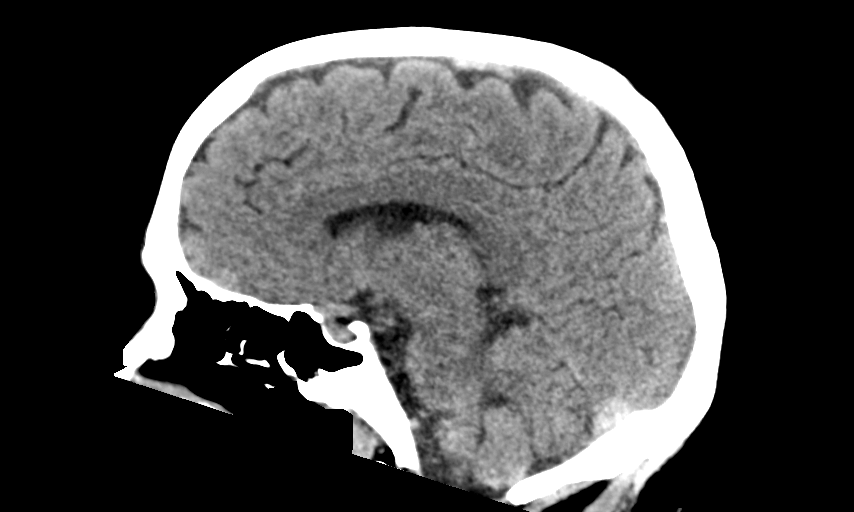
[im 45/67  brain]
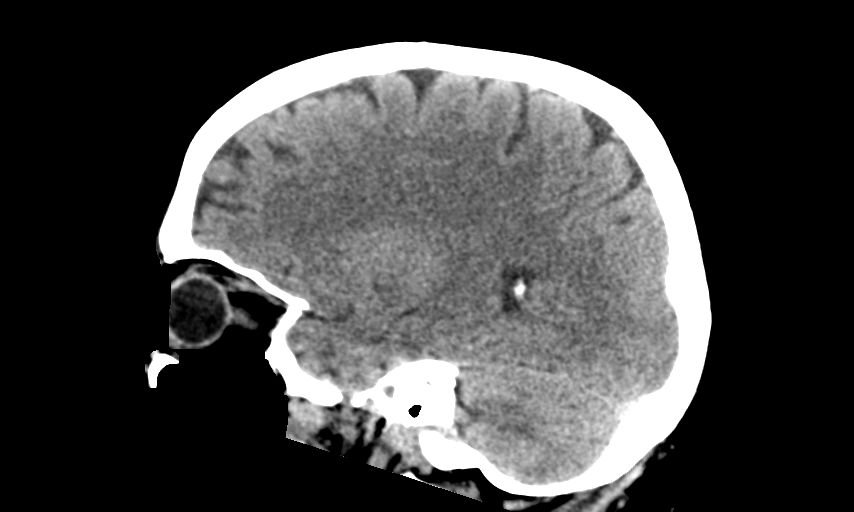

[14 of 47 positions shown; findings below may reference images not displayed]

FINDINGS: Brain: No evidence of acute infarction, hemorrhage, hydrocephalus,
extra-axial collection or mass lesion/mass effect.

Vascular: No hyperdense vessel or unexpected calcification.

Skull: Intact.  No focal lesion.

Sinuses/Orbits: Negative.

Other: None.
IMPRESSION: Normal head CT.

## 2021-04-05 NOTE — ED Triage Notes (Signed)
Pt arrived to ED with [facility] after husband called for seizure like activity. Husband woke up with pt having full body convulsions and unresponsive lasting about 5 mnutes. Pt was postictal on EMS arrival, currently A&Ox3 c/o sciatica pain

## 2021-04-05 NOTE — ED Provider Notes (Signed)
[facility] EMERGENCY DEPARTMENT Provider Note   CSN: [id number] Arrival date & time: 04/05/21  7062     History Chief Complaint  Patient presents with   Seizures    Julia Macias is a 56 y.o. female.  HPI Patient was seen by me at 9:55 AM after a prolonged wait in the emergency department waiting room.  She had laboratory testing done initially but has not had medical screening evaluation.  As I entered the room, the patient was awake, and initially was unable to give any history.  Due to aphasic status.  Level 5 caveat-aphasia  History reviewed. No pertinent past medical history.  There are no problems to display for this patient.   History reviewed. No pertinent surgical history.   OB History   No obstetric history on file.     Family History  Problem Relation Age of Onset   Healthy Mother    Healthy Father     Social History   Tobacco Use   Smoking status: Never   Smokeless tobacco: Never  Substance Use Topics   Alcohol use: Yes    Comment: occ    Home Medications Prior to Admission medications   Medication Sig Start Date End Date Taking? Authorizing Provider  triamcinolone cream (KENALOG) 0.1 % Apply 1 application topically 3 (three) times daily as needed. 01/25/21   Lurene Shadow, PA-C    Allergies    Patient has no known allergies.  Review of Systems   Review of Systems  Unable to perform ROS: Mental status change   Physical Exam Updated Vital Signs BP 121/74   Pulse 77   Temp 97.9 F (36.6 C) (Oral)   Resp 15   Ht 5\' 9"  (1.753 m)   Wt 89.8 kg   SpO2 96%   BMI 29.24 kg/m   Physical Exam Vitals and nursing note reviewed.  Constitutional:      Appearance: She is well-developed. She is not ill-appearing.  HENT:     Head: Normocephalic and atraumatic.     Right Ear: External ear normal.     Left Ear: External ear normal.  Eyes:     Extraocular Movements: Extraocular movements intact.     Conjunctiva/sclera:  Conjunctivae normal.     Pupils: Pupils are equal, round, and reactive to light.  Neck:     Trachea: Phonation normal.  Cardiovascular:     Rate and Rhythm: Normal rate.  Pulmonary:     Effort: Pulmonary effort is normal.  Abdominal:     General: There is no distension.  Musculoskeletal:        General: Normal range of motion.     Cervical back: Normal range of motion and neck supple.  Skin:    General: Skin is warm and dry.  Neurological:     Mental Status: She is alert.     Cranial Nerves: No cranial nerve deficit.     Sensory: No sensory deficit.     Motor: No abnormal muscle tone.     Coordination: Coordination normal.     Comments: Expressive aphasia.  No neglect, no nystagmus.  No dysarthria.  Psychiatric:        Mood and Affect: Mood normal.        Behavior: Behavior normal.    ED Results / Procedures / Treatments   Labs (all labs ordered are listed, but only abnormal results are displayed) Labs Reviewed  BASIC METABOLIC PANEL - Abnormal; Notable for the following components:  Result Value   Glucose, Bld 131 (*)    All other components within normal limits  CBG MONITORING, ED - Abnormal; Notable for the following components:   Glucose-Capillary 120 (*)    All other components within normal limits  CBC  I-STAT BETA HCG BLOOD, ED ([facility], [facility], [facility] ONLY)    EKG None  Radiology CT Head Wo Contrast  Result Date: 04/05/2021 CLINICAL DATA:  New onset seizures last night. EXAM: CT HEAD WITHOUT CONTRAST TECHNIQUE: Contiguous axial images were obtained from the base of the skull through the vertex without intravenous contrast. COMPARISON:  None. FINDINGS: Brain: No evidence of acute infarction, hemorrhage, hydrocephalus, extra-axial collection or mass lesion/mass effect. Vascular: No hyperdense vessel or unexpected calcification. Skull: Intact.  No focal lesion. Sinuses/Orbits: Negative. Other: None. IMPRESSION: Normal head CT. Electronically Signed   By: Drusilla Kanner  M.D.   On: 04/05/2021 10:49    Procedures Procedures   Medications Ordered in ED Medications - No data to display  ED Course  I have reviewed the triage vital signs and the nursing notes.  Pertinent labs & imaging results that were available during my care of the patient were reviewed by me and considered in my medical decision making (see chart for details).  Clinical Course as of 04/05/21 1241  Mon Apr 05, 2021  5170 When I initially saw the patient, she had expressive aphasia.  As I began to talk to her she, gradually improved. [EW]  1137 Alert, conversant and describes minimal sleep over the weekend due to an upcoming job interview that was today at 11 AM.  She is no longer aphasic.  Neurologic exam is otherwise normal. [EW]  1233 The patient's husband is now in the room.  He was with the patient, sleeping in bed together, at 1:10 AM this morning.  At that time he heard her girl and then found her "convulsing," and later "foaming at the mouth."  The convulsing lasted about 5 minutes and was followed by snoring.  The patient gradually woke up as he was waiting for EMS to arrive.  She was able to recognize him before EMS left the home.  After arrival here, the patient was texting with her husband, and communicated with him while she was in the waiting room, and in the examination room.  There has been no recurrence of altered mental status or seizure activity according to the husband during the time that he was with her. [EW]    Clinical Course User Index [EW] Mancel Bale, MD   MDM Rules/Calculators/A&P                            Patient Vitals for the past 24 hrs:  BP Temp Temp src Pulse Resp SpO2 Height Weight  04/05/21 1210 121/74 -- -- 77 15 96 % -- --  04/05/21 1000 (!) 113/102 -- -- 92 12 100 % -- --  04/05/21 0937 -- -- -- -- -- -- 5\' 9"  (1.753 m) 89.8 kg  04/05/21 0937 108/65 97.9 F (36.6 C) Oral 68 18 99 % -- --  04/05/21 0639 101/70 -- -- 77 16 100 % -- --  04/05/21  0409 121/73 -- -- 83 16 99 % -- --    12:41 PM Reevaluation with update and discussion. After initial assessment and treatment, an updated evaluation reveals she is stable, no seizure activity during period of observation.  Findings discussed and questions answered.  Husband at  the bedside during this conversation.Mancel Bale   Medical Decision Making:  This patient is presenting for evaluation of seizure, which does require a range of treatment options, and is a complaint that involves a high risk of morbidity and mortality. The differential diagnoses include seizure, cardiac arrhythmia, vasovagal episode. I decided to review old records, and in summary middle-aged female presenting with described seizure, with spontaneous recovery, and no recurrence.  She is under significant stress and therefore has not been sleeping well recently.  Her husband reports that she lost her job and has been struggling to get a new job. I obtain additional historical information from husband at bedside.  Clinical Laboratory Tests Ordered, included CBC, Metabolic panel, and Pregnancy test. Review indicates normal findings, except glucose slightly elevated, nonfasting. Radiologic Tests Ordered, included CT head.  I independently Visualized: CT images, which show normal   Critical Interventions-clinical evaluation, laboratory testing, CT imaging, discussion with husband, observation reassess  After These Interventions, the Patient was reevaluated and was found stable for discharge.  Patient with first-time seizure, clear triggers.  She is improving to be discharged with outpatient follow-up by neurology.  No indication for hospitalization at this time.  No indication for initiation of antiepileptic medications  CRITICAL CARE-no Performed by: Mancel Bale  Nursing Notes Reviewed/ Care Coordinated Applicable Imaging Reviewed Interpretation of Laboratory Data incorporated into ED treatment  The patient  appears reasonably screened and/or stabilized for discharge and I doubt any other medical condition or other [facility] requiring further screening, evaluation, or treatment in the ED at this time prior to discharge.  Plan: Home Medications-continue usual; Home Treatments-rest, fluids, avoid risky activities, until evaluated by neurology; return here if the recommended treatment, does not improve the symptoms; Recommended follow up-neurology follow-up ASAP for likely EEG, and further recommendations     Final Clinical Impression(s) / ED Diagnoses Final diagnoses:  Seizure [facility])    Rx / DC Orders ED Discharge Orders     None        Mancel Bale, MD 04/05/21 1242

## 2021-04-05 NOTE — Discharge Instructions (Addendum)
The testing today is reassuring.  There are no signs of serious problems that could have caused the seizure.  The seizure may have been caused by a combination of stress and lack of sleep.  It is important to not drive, climb a ladder, or swim, as well as similar activities; due to risk for injury to self or others should you have another seizure.  In the meantime try to get plenty of rest, drink a lot of fluids and eat 3 meals a day.  Call the neurology office for a follow-up appointment as soon as possible.  Return here if needed

## 2021-04-08 ENCOUNTER — Ambulatory Visit (INDEPENDENT_AMBULATORY_CARE_PROVIDER_SITE_OTHER): Payer: BC Managed Care – PPO | Admitting: Neurology

## 2021-04-08 ENCOUNTER — Other Ambulatory Visit: Payer: Self-pay

## 2021-04-08 ENCOUNTER — Encounter: Payer: Self-pay | Admitting: Neurology

## 2021-04-08 VITALS — BP 111/74 | HR 74 | Resp 18 | Ht 69.0 in | Wt 200.0 lb

## 2021-04-08 DIAGNOSIS — R569 Unspecified convulsions: Secondary | ICD-10-CM

## 2021-04-08 NOTE — Progress Notes (Signed)
NEUROLOGY CONSULTATION NOTE  Julia Macias MRN: [id number] DOB: Apr 27, 1966  Referring provider: Dr. Mancel Bale (ER) Primary care provider: none listed  Reason for consult:  seizure  Dear Dr Effie Shy:  Thank you for your kind referral of Julia Macias for consultation of the above symptoms. Although her history is well known to you, please allow me to reiterate it for the purpose of our medical record. The patient was accompanied to the clinic by her daughter Julia Macias who also provides collateral information. Records and images were personally reviewed where available.   HISTORY OF PRESENT ILLNESS: This is a pleasant 55 year old right-handed woman with a history of ADHD presenting for new onset seizure on 04/05/21. ER notes were reviewed. She had been up for a day and a half working on her portfolio, she usually drinks a good amount of water but that day she did not drink as much. She also had a couple of glasses of wine. She recalls going to sleep around 1am, then at least 15 minutes later her husband heard a noise and found her convulsing, foaming at the mouth. Convulsion lasted 5 minutes followed by sonorous respirations. She woke up in the ambulance. She had been in the waiting room for over 8 hours and was communicating with her family with no issues. Per ER notes, when she was evaluated at 9am, she was awake, unable to give history, aphasic. This gradually improved as visit continued and she reported minimal sleep over the weekend due to a job interview that day. She and her daughter state that she had been woken up from a deep sleep and she typically has difficulty orienting herself when first waking up. She recalls feeling groggy and does not recall everything Dr. Effie Shy had said, but does not feel she had word-finding difficulties out of the ordinary. Since then, her left hamstring has been sore and left sciatic pain flared up. She and her daughter deny any staring/unresponsive  episodes, gaps in time, olfactory/gustatory hallucinations, deja vu, rising epigastric sensation, focal numbness/tingling/weakness, myoclonic jerks. She recalls having a migraine that weekend, but nothing out of the ordinary. She has occasional migraines and sinus headaches. She denies any dizziness, vision changes, neck pain, bowel/bladder dysfunction. She had a normal birth and early development.  There is no history of febrile convulsions, CNS infections such as meningitis/encephalitis, significant traumatic brain injury, neurosurgical procedures, or family history of seizures.  CBC, BMP in the ER were unremarkable. I personally reviewed head CT without contrast which did not show any acute changes.   PAST MEDICAL HISTORY: History reviewed. No pertinent past medical history.  PAST SURGICAL HISTORY: History reviewed. No pertinent surgical history.  MEDICATIONS: Current Outpatient Medications on File Prior to Visit  Medication Sig Dispense Refill   B Complex Vitamins (VITAMIN B COMPLEX 100 IJ) vitamin B complex     Calcium Carb-Cholecalciferol (CALCIUM 1000 + D PO) calcium     Multiple Vitamins-Minerals (MULTIVITAMIN ADULT EXTRA C PO) multivitamin     triamcinolone cream (KENALOG) 0.1 % Apply 1 application topically 3 (three) times daily as needed. 30 g 0   No current facility-administered medications on file prior to visit.    ALLERGIES: No Known Allergies  FAMILY HISTORY: Family History  Problem Relation Age of Onset   Healthy Mother    Healthy Father     SOCIAL HISTORY: Social History   Socioeconomic History   Marital status: Single    Spouse name: Not on file   Number of children: Not  on file   Years of education: Not on file   Highest education level: Not on file  Occupational History   Not on file  Tobacco Use   Smoking status: Never   Smokeless tobacco: Never  Substance and Sexual Activity   Alcohol use: Yes    Comment: occ   Drug use: Not on file   Sexual  activity: Not on file  Other Topics Concern   Not on file  Social History Narrative   Not on file   Social Determinants of Health   Financial Resource Strain: Not on file  Food Insecurity: Not on file  Transportation Needs: Not on file  Physical Activity: Not on file  Stress: Not on file  Social Connections: Not on file  Intimate Partner Violence: Not on file     PHYSICAL EXAM: Vitals:   04/08/21 1005  BP: 111/74  Pulse: 74  Resp: 18  SpO2: 96%   General: No acute distress Head:  Normocephalic/atraumatic Skin/Extremities: No rash, no edema Neurological Exam: Mental status: alert and oriented to person, place, and time, no dysarthria or aphasia, Fund of knowledge is appropriate.  Recent and remote memory are intact, 3/3 delayed recall.  Attention and concentration are normal, 5/5 WORLD backwards. Cranial nerves: CN I: not tested CN II: pupils equal, round and reactive to light, visual fields intact CN III, IV, VI:  full range of motion, no nystagmus, no ptosis CN V: facial sensation intact CN VII: upper and lower face symmetric CN VIII: hearing intact to conversation Bulk & Tone: normal, no fasciculations. Motor: 5/5 throughout with no pronator drift. Sensation: intact to light touch, cold, pin, vibration and joint position sense.  No extinction to double simultaneous stimulation.  Romberg test negative Deep Tendon Reflexes: brisk +3 on right UE with +Hoffman sign, otherwise +2 throughout, no ankle clonus Plantar responses: downgoing bilaterally Cerebellar: no incoordination on finger to nose testing Gait: narrow-based and steady, no ataxia Tremor: none   IMPRESSION: This is a pleasant 55 year old right-handed woman with a history of ADHD, no clear epilepsy risk factors, presenting for new onset nocturnal seizure on 04/05/21. Her neurological exam is overall unremarkable except for brisk reflexes. MRI brain with and without contrast and 1-hour EEG will be ordered as  part of seizure work-up. We discussed that after an initial seizure, unless there are significant risk factors, an abnormal neurological exam, an EEG showing epileptiform abnormalities, and/or abnormal neuroimaging, treatment with an antiepileptic drug is not indicated. We discussed 10% of the population may have a single seizure. Patients with a single unprovoked seizure have a recurrence rate of 33% after a single seizure and 73% after a second seizure. We discussed [place] driving restrictions which indicate a patient needs to free of seizures or events of altered awareness for 6 months prior to resuming driving. The patient agreed to comply with these restrictions.  Avoidance of seizure triggers discussed. Follow-up in 3 months, they know to call for any changes.   Thank you for allowing me to participate in the care of this patient. Please do not hesitate to call for any questions or concerns.   Patrcia Dolly, M.D.  CC: Dr. Effie Shy

## 2021-04-08 NOTE — Patient Instructions (Addendum)
Schedule open MRI brain with and without contrast  2. Schedule 1-hour EEG  3. Follow-up in 3 months, call for any changes  Seizure Precautions: 1. If medication has been prescribed for you to prevent seizures, take it exactly as directed.  Do not stop taking the medicine without talking to your doctor first, even if you have not had a seizure in a long time.   2. Avoid activities in which a seizure would cause danger to yourself or to others.  Don't operate dangerous machinery, swim alone, or climb in high or dangerous places, such as on ladders, roofs, or girders.  Do not drive unless your doctor says you may.  3. If you have any warning that you may have a seizure, lay down in a safe place where you can't hurt yourself.    4.  No driving for 6 months from last seizure, as per [facility].   Please refer to the following link on the [employer]ile[employer] of [employer]'s website for more information: http://www.epilepsyfoundation.org/answerplace/Social/driving/drivingu.cfm   5.  Maintain good sleep hygiene. Avoid alcohol  6.  Contact your doctor if you have any problems that may be related to the medicine you are taking.  7.  Call 911 and bring the patient back to the ED if:        A.  The seizure lasts longer than 5 minutes.       B.  The patient doesn't awaken shortly after the seizure  C.  The patient has new problems such as difficulty seeing, speaking or moving  D.  The patient was injured during the seizure  E.  The patient has a temperature over 102 F (39C)  F.  The patient vomited and now is having trouble breathing       We have sent a referral to [facility] for your MRI and they will call you directly to schedule your appointment. They are located at [address] [facility]. If you need to contact them directly please call [phone].

## 2021-04-14 ENCOUNTER — Telehealth: Payer: Self-pay | Admitting: Neurology

## 2021-04-14 NOTE — Telephone Encounter (Signed)
Type of mri doesn't change price keeping pt MRI so its not pushed back

## 2021-04-14 NOTE — Telephone Encounter (Signed)
Pt called and informed.

## 2021-04-14 NOTE — Telephone Encounter (Signed)
Pt is sch for a open MRI on 04-29-21 but has changed her mind and wants to do a normal MRI ( not open ) she is worried about how much the open MRI will cost her. Please change order to a normal MRI

## 2021-04-19 ENCOUNTER — Other Ambulatory Visit: Payer: Self-pay

## 2021-04-19 ENCOUNTER — Ambulatory Visit (INDEPENDENT_AMBULATORY_CARE_PROVIDER_SITE_OTHER): Payer: BC Managed Care – PPO | Admitting: Neurology

## 2021-04-19 DIAGNOSIS — R569 Unspecified convulsions: Secondary | ICD-10-CM

## 2021-04-19 NOTE — Procedures (Signed)
ELECTROENCEPHALOGRAM REPORT  Date of Study: 04/19/2021  Patient's Name: Julia Macias MRN: [id number] Date of Birth: [birth year]  Referring Provider: Dr. Patrcia Dolly  Clinical History:  This is a 55 year old woman with new onset nocturnal seizure.   Medications: VITAMIN B COMPLEX 100 IJ CALCIUM 1000 + D PO MULTIVITAMIN ADULT EXTRA C PO KENALOG 0.1 % cream  Technical Summary: A multichannel digital 1-hour EEG recording measured by the international 10-20 system with electrodes applied with paste and impedances below 5000 ohms performed in our laboratory with EKG monitoring in an awake and asleep patient.  Hyperventilation was not performed. Photic stimulation was performed.  The digital EEG was referentially recorded, reformatted, and digitally filtered in a variety of bipolar and referential montages for optimal display.    Description: The patient is awake and asleep during the recording.  During maximal wakefulness, there is a symmetric, medium voltage 10-10.5 Hz posterior dominant rhythm that attenuates with eye opening.  The record is symmetric.  During drowsiness and sleep, there is an increase in theta slowing of the background with shifting asymmetry over the bilateral temporal region,at times sharply contoured without clear epileptogenic potential.  Vertex waves and symmetric sleep spindles were seen.  Photic stimulation did not elicit any abnormalities.  There were no epileptiform discharges or electrographic seizures seen.    EKG lead was unremarkable.  Impression: This 1-hour awake and asleep EEG is within normal limits.  Clinical Correlation: A normal EEG does not exclude a clinical diagnosis of epilepsy.  If further clinical questions remain, prolonged EEG may be helpful.  Clinical correlation is advised.   Patrcia Dolly, M.D.

## 2021-04-20 NOTE — Progress Notes (Signed)
Pt advised of EEG results, voiced understanding.

## 2021-04-25 ENCOUNTER — Other Ambulatory Visit: Payer: BC Managed Care – PPO

## 2021-04-28 ENCOUNTER — Ambulatory Visit: Payer: BC Managed Care – PPO | Admitting: Neurology

## 2021-04-29 ENCOUNTER — Other Ambulatory Visit: Payer: Self-pay

## 2021-04-29 ENCOUNTER — Ambulatory Visit
Admission: RE | Admit: 2021-04-29 | Discharge: 2021-04-29 | Disposition: A | Discharge status: Home / Self Care | Payer: BC Managed Care – PPO | Source: Ambulatory Visit | Attending: Neurology | Admitting: Neurology

## 2021-04-29 DIAGNOSIS — R569 Unspecified convulsions: Secondary | ICD-10-CM | POA: Diagnosis not present

## 2021-04-29 IMAGING — MR MR HEAD WO/W CM
15 series · 48 of 48 positions shown · IV contrast (multihance)
Comparison: Head CT [DATE].

CLINICAL DATA: New onset seizure (HCC) [JN] ([JN]-CM). Seizure.
Additional history provided by technologist: Patient reports
possible seizure.

EXAM:
MRI HEAD WITHOUT AND WITH CONTRAST
TECHNIQUE: Multiplanar, multiecho pulse sequences of the brain and surrounding
structures were obtained without and with intravenous contrast.
CONTRAST:  19mL MULTIHANCE GADOBENATE DIMEGLUMINE 529 MG/ML IV SOLN

[Series 2: T1 · sagittal · 5.0mm · 0.45mm/px · 2 of 25 slices shown]
[im 1/25]
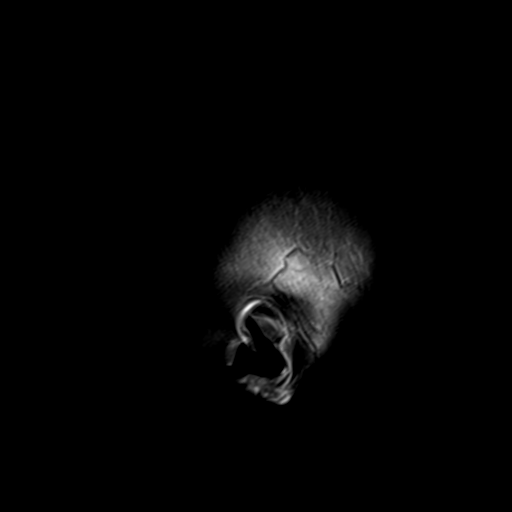
[im 25/25]
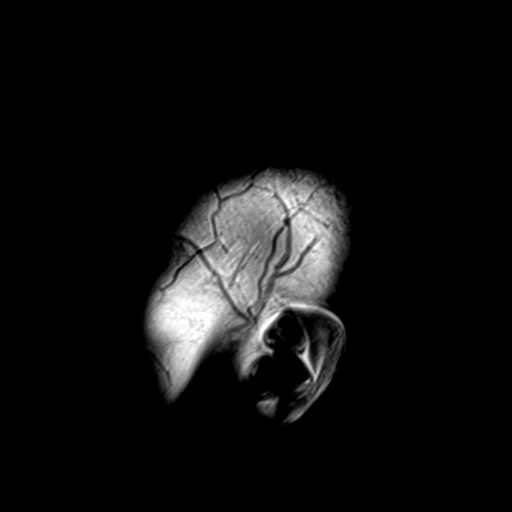

[Series 3: ax ep2d_diff_3 · axial · 3.0mm · 1.80mm/px · z∈[-54,+104]mm · 5 of 103 slices shown]
[im 1/103]
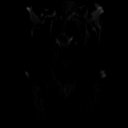
[im 26/103]
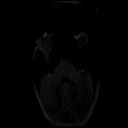
[im 52/103]
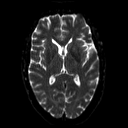
[im 77/103]
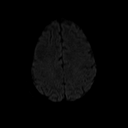
[im 103/103]
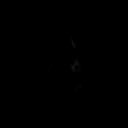

[Series 4: ax ep2d_diff_3_adc · axial · 3.0mm · 1.80mm/px · z∈[-57,+104]mm · 3 of 52 slices shown]
[im 1/52]
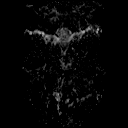
[im 26/52]
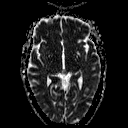
[im 52/52]
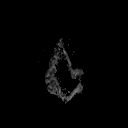

[Series 5: cor ep2d_diff · coronal · 5.0mm · 1.77mm/px · 3 of 59 slices shown]
[im 1/59]
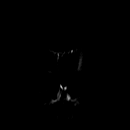
[im 30/59]
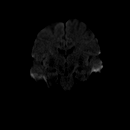
[im 59/59]
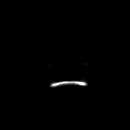

[Series 6: cor ep2d_diff_adc · coronal · 5.0mm · 1.77mm/px · 2 of 30 slices shown]
[im 1/30]
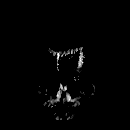
[im 30/30]
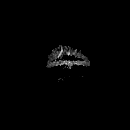

[Series 8: swi_images · axial · 2.0mm · 0.98mm/px · z∈[-54,+103]mm · 4 of 80 slices shown]
[im 1/80]
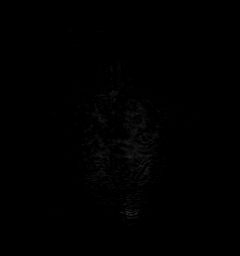
[im 27/80]
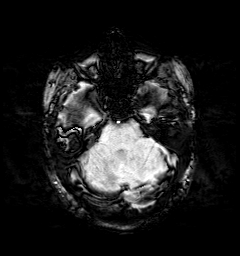
[im 53/80]
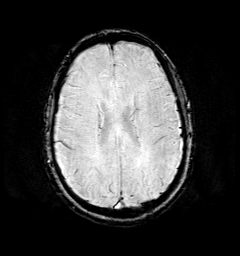
[im 80/80]
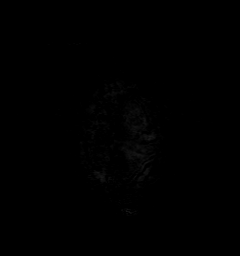

[Series 9: FLAIR · axial · 3.0mm · 0.43mm/px · z∈[-53,+98]mm · 2 of 40 slices shown (1 of 2)]
[im 1/40]
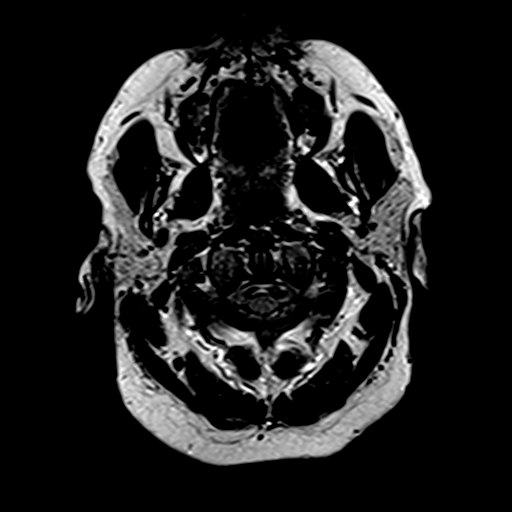
[im 40/40]
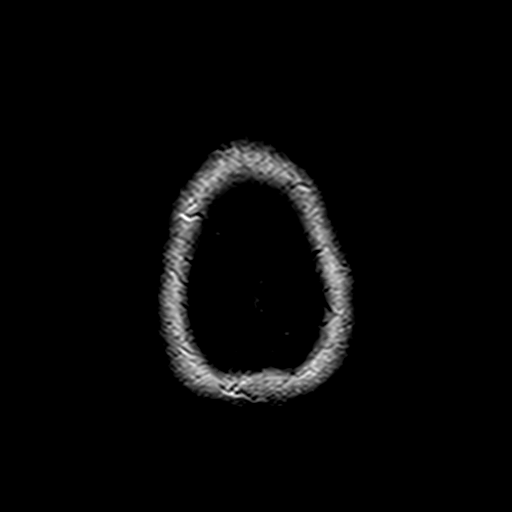

[Series 10: T2 · axial · 5.0mm · 0.65mm/px · z∈[-59,+108]mm · 2 of 29 slices shown (1 of 3)]
[im 1/29]
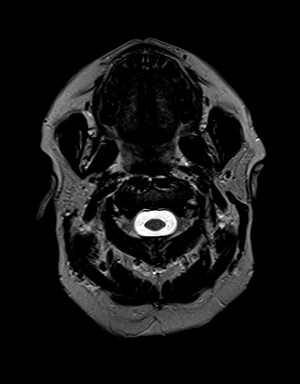
[im 29/29]
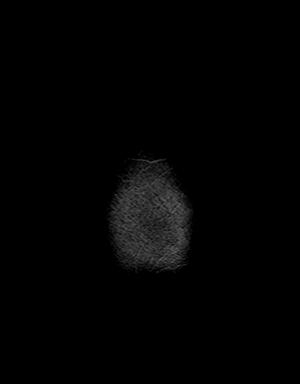

[Series 11: T2 · coronal · 3.0mm · 0.56mm/px · 1 of 23 slices shown (2 of 3)]
[im 1/23]
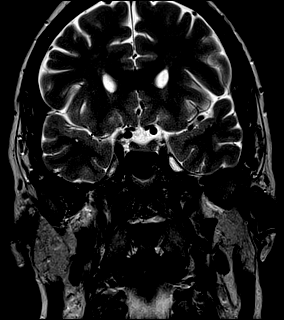

[Series 12: FLAIR · coronal · 3.0mm · 0.35mm/px · 1 of 25 slices shown (2 of 2)]
[im 1/25]
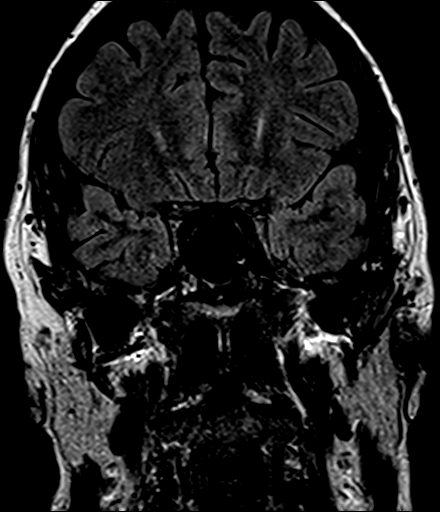

[Series 13: t1_mpr_tra · axial · 1.0mm · 0.72mm/px · z∈[-52,+106]mm · 8 of 160 slices shown]
[im 1/160]
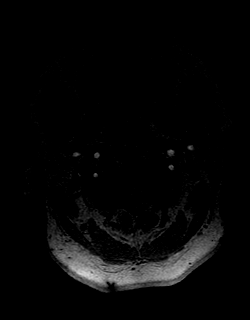
[im 23/160]
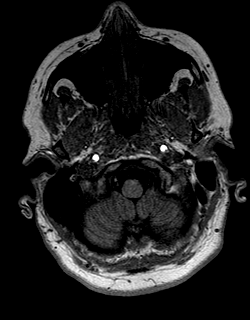
[im 46/160]
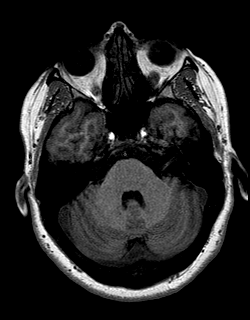
[im 69/160]
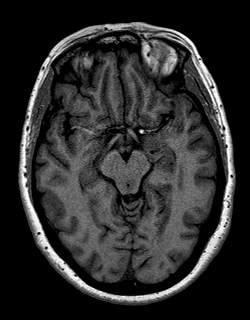
[im 91/160]
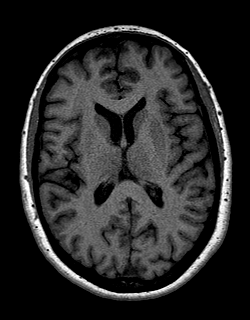
[im 114/160]
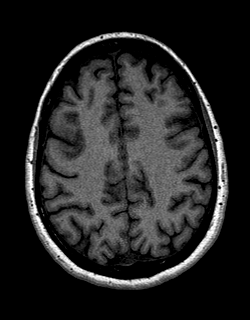
[im 137/160]
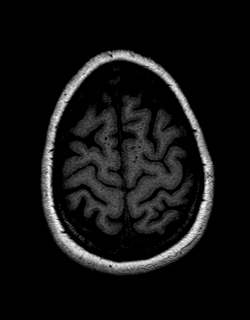
[im 160/160]
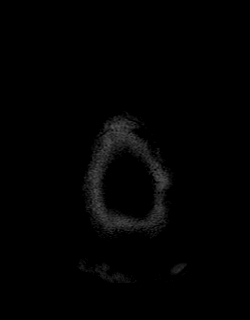

[Series 14: reformat cor · coronal · 1.0mm · 0.72mm/px · 3 of 60 slices shown]
[im 1/60]
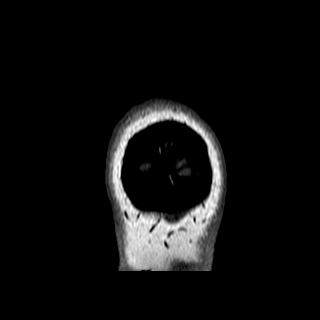
[im 30/60]
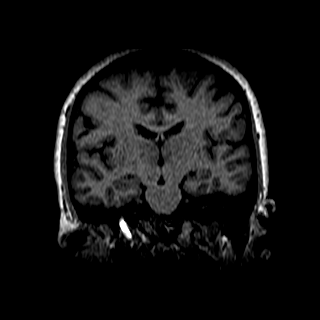
[im 60/60]
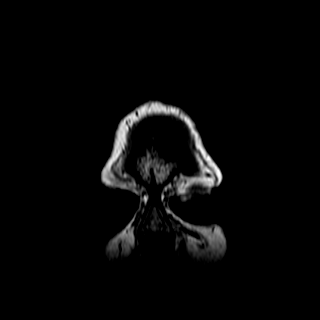

[Series 15: T2 · coronal · 5.0mm · 0.45mm/px · 2 of 31 slices shown (3 of 3)]
[im 1/31]
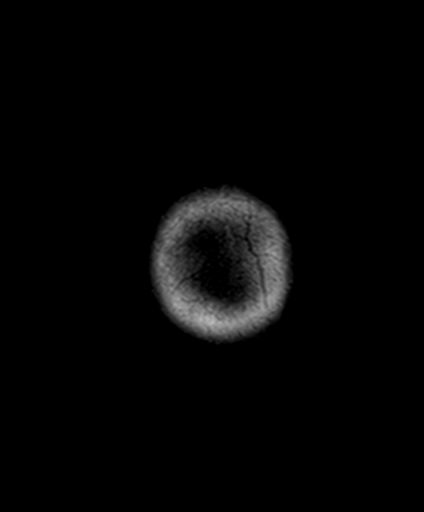
[im 31/31]
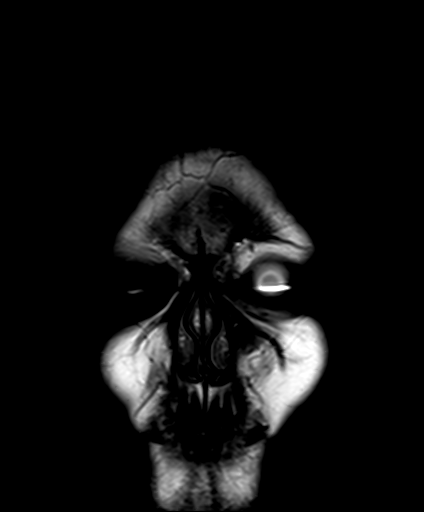

[Series 16: T1 post-contrast · coronal · 5.0mm · 0.72mm/px · 2 of 29 slices shown]
[im 1/29]
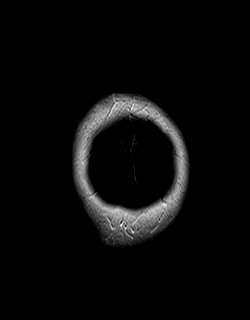
[im 29/29]
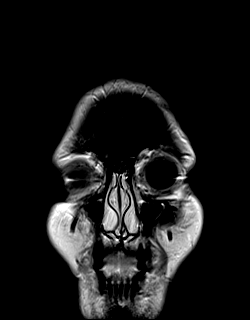

[Series 17: post t1_mpr_tra · axial · 1.0mm · 0.72mm/px · z∈[-52,+106]mm · 8 of 160 slices shown]
[im 1/160]
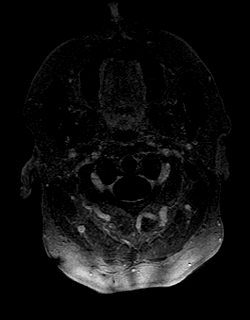
[im 23/160]
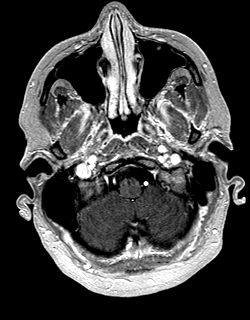
[im 46/160]
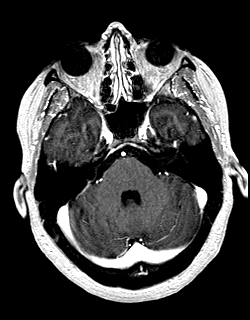
[im 69/160]
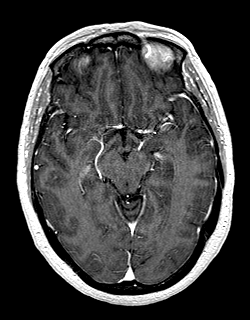
[im 91/160]
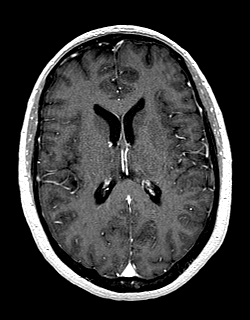
[im 114/160]
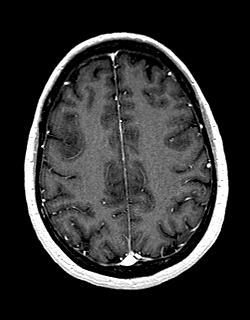
[im 137/160]
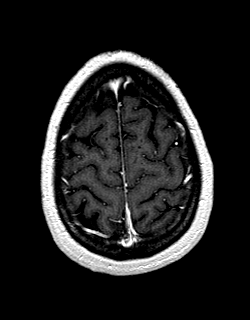
[im 160/160]
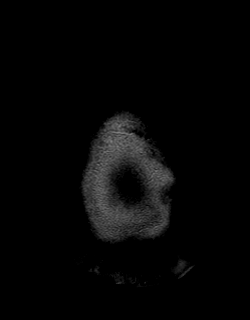

[48 of 48 positions shown; findings below may reference images not displayed]

FINDINGS: Brain:

Cerebral volume is normal.

No cortical encephalomalacia is identified. No significant cerebral
white matter disease for age.

Apparent small symmetric foci of T2/FLAIR hyperintensity within the
bilateral lentiform nuclei/internal capsules are appreciated on the
axial T2/FLAIR sequence only, and likely artifactual.

The hippocampi are symmetric in size and signal.

Small chronic lacunar infarct within the left cerebellar hemisphere.

There is no acute infarct.

No evidence of an intracranial mass.

No chronic intracranial blood products.

No extra-axial fluid collection.

No midline shift.

No pathologic intracranial enhancement is identified.

Vascular: Maintained flow voids within the proximal large arterial
vessels.

Skull and upper cervical spine: No focal suspicious marrow lesion.

Sinuses/Orbits: Visualized orbits show no acute finding. Mild
mucosal thickening within the left maxillary sinus.
IMPRESSION: Small chronic lacunar infarct within the left cerebellar hemisphere.

Otherwise unremarkable MRI appearance of the brain. No evidence of
acute intracranial abnormality.

Mild mucosal thickening within the left maxillary sinus.

## 2021-04-29 MED ORDER — GADOBENATE DIMEGLUMINE 529 MG/ML IV SOLN
19.0000 mL | Freq: Once | INTRAVENOUS | Status: AC | PRN
  Administered 2021-04-29: 19 mL via INTRAVENOUS

## 2021-05-05 ENCOUNTER — Telehealth: Payer: Self-pay

## 2021-05-05 NOTE — Telephone Encounter (Signed)
-----   Message from Van Clines, MD sent at 05/05/2021  8:37 AM EDT ----- Pls let her know brain MRI overall normal, no tumor, bleed, or new stroke. At this point, we don't start medication, however if she has any change in symptoms, please have her call our office. F/u as scheduled. Thanks

## 2021-05-05 NOTE — Telephone Encounter (Signed)
Pt stated that she wants to know if she can drive if everything is normal? Was it seizure or dehydration

## 2021-05-05 NOTE — Telephone Encounter (Signed)
Pls let her know that she did have a seizure from the description given. If anyone passes out from any reason (brain, heart issues), DMV says no driving until 6 months event-free, regardless if the tests come back normal. The reason it is 6 months is because if someone will have another seizure, it is usually within the first 6 months, so the DMV wants that waiting period to ensure she and everyone else on the road are safe. Thanks

## 2021-05-07 NOTE — Telephone Encounter (Signed)
Patient advised of Dr. Lahoma Rocker note Pt unable to drive for 6 months.

## 2021-05-28 DIAGNOSIS — Z1231 Encounter for screening mammogram for malignant neoplasm of breast: Secondary | ICD-10-CM | POA: Diagnosis not present

## 2021-05-28 DIAGNOSIS — Z01419 Encounter for gynecological examination (general) (routine) without abnormal findings: Secondary | ICD-10-CM | POA: Diagnosis not present

## 2021-05-28 DIAGNOSIS — Z6830 Body mass index (BMI) 30.0-30.9, adult: Secondary | ICD-10-CM | POA: Diagnosis not present

## 2021-06-16 ENCOUNTER — Telehealth: Payer: Self-pay | Admitting: Neurology

## 2021-06-16 DIAGNOSIS — R569 Unspecified convulsions: Secondary | ICD-10-CM

## 2021-06-16 NOTE — Telephone Encounter (Signed)
Pt called in stating she had another seizure 06/14/21. It acted similar to the last one.

## 2021-06-16 NOTE — Telephone Encounter (Signed)
Left message to call office to discuss the seizure at 1:32 06/16/2021

## 2021-06-17 NOTE — Telephone Encounter (Signed)
Tried to call again no answer. 

## 2021-06-18 ENCOUNTER — Other Ambulatory Visit: Payer: Self-pay | Admitting: Obstetrics and Gynecology

## 2021-06-18 DIAGNOSIS — Z1239 Encounter for other screening for malignant neoplasm of breast: Secondary | ICD-10-CM

## 2021-06-18 NOTE — Telephone Encounter (Signed)
24 hour eeg order placed in Epic

## 2021-06-18 NOTE — Telephone Encounter (Signed)
Pt c/o: seizure Missed medications?  No. Sleep deprived?  No. Alcohol intake?  No. Back to their usual baseline self?  Yes.  . If no, advise go to ER Any increase in stress? No Any trigger?no happened at night  Current medications prescribed by Dr. Karel Jarvis: no meds for seizure    Asking if it could be related to menopause?

## 2021-06-18 NOTE — Telephone Encounter (Signed)
Spoke to patient, she had another nocturnal seizure 06/14/21. No triggers this time, no sleep deprivation, was hydrated. It occurred after she went to sleep, husband said same thing lasting 5 mins, took another 10-13mins to come out of it. No tongue bite or incont. No focal weakness. Discussed risks for recurrent seizure after 2nd seizure, and recommendation to start medication. She is hesitant but agrees to start Levetiracetam 500mg  BID, side effects discussed. Also recommend 24-hour EEG. She is aware of [place] driving laws to stop driving until 6 months seizure-free. F/u as scheduled in Nov.  Heather, pls order 24-hour EEG. Dec, I hope we have an opening for her 24-hour EEG before her 11/2 appt with me? Thanks!

## 2021-06-21 ENCOUNTER — Telehealth: Payer: Self-pay | Admitting: Neurology

## 2021-06-21 NOTE — Telephone Encounter (Signed)
Pt said she is undecided on what she wants to do. Will the test change the outcome? The first test came back okay. Will doing another test change the outcome, the course of her treatment going forward? If she has to go on medicine regardless, why take another test? If all the test comes back normal, will doing the 24hr test change anything?

## 2021-06-22 MED ORDER — LEVETIRACETAM 500 MG PO TABS: 500.0000 mg | ORAL_TABLET | Freq: Two times a day (BID) | ORAL | 5 refills | Status: DC

## 2021-06-22 NOTE — Addendum Note (Signed)
Addended by: Van Clines on: 06/22/2021 09:29 PM   Modules accepted: Orders

## 2021-06-22 NOTE — Telephone Encounter (Signed)
Pt was called to see what she was really asking. Pt wanted to know if doing the 24 hour eeg will change the plan of starting her on medication? She is not having  her episodes on a regular biases and is worried the 24 hour will be normal and a waist of money at this time? She is worried about cost!  Asking will this test give answer to why she is having seizures? She is not saying NO to the eeg she just wants to know if its worth it.

## 2021-06-22 NOTE — Telephone Encounter (Signed)
Pt called and informed that the EEG is more information, more data. The office EEG we did is not like a pregnancy test that is positive or negative, just a snapshot of her brainwaves, and potentially the more brain wave information we have with a longer EEG, we may be able to see where the seizures are coming from. But yes, it could also be normal especially if she does not have a seizure during the test. Since she is starting medication, we can certainly hold off on the test and see how she does with medication on board. If seizures recur (hopefully not), then Dr Karel Jarvis would do the prolonged EEG at that point.  Pt would like to Start the medication and if she has another seizure then do the prolong EEG pharmacy she uses [facility] on [address],

## 2021-06-22 NOTE — Telephone Encounter (Signed)
Pls let her know that the EEG is more information, more data. The office EEG we did is not like a pregnancy test that is positive or negative, just a snapshot of her brainwaves, and potentially the more brain wave information we have with a longer EEG, we may be able to see where the seizures are coming from. But yes, it could also be normal especially if she does not have a seizure during the test. Since she is starting medication, we can certainly hold off on the test and see how she does with medication on board. If seizures recur (hopefully not), then I would do the prolonged EEG at that point. Thanks

## 2021-07-14 ENCOUNTER — Ambulatory Visit: Payer: BC Managed Care – PPO | Admitting: Neurology

## 2021-07-22 DIAGNOSIS — L821 Other seborrheic keratosis: Secondary | ICD-10-CM | POA: Diagnosis not present

## 2021-07-22 DIAGNOSIS — D2339 Other benign neoplasm of skin of other parts of face: Secondary | ICD-10-CM | POA: Diagnosis not present

## 2021-07-22 DIAGNOSIS — L820 Inflamed seborrheic keratosis: Secondary | ICD-10-CM | POA: Diagnosis not present

## 2021-07-22 DIAGNOSIS — L538 Other specified erythematous conditions: Secondary | ICD-10-CM | POA: Diagnosis not present

## 2021-07-22 DIAGNOSIS — D485 Neoplasm of uncertain behavior of skin: Secondary | ICD-10-CM | POA: Diagnosis not present

## 2021-07-22 DIAGNOSIS — L814 Other melanin hyperpigmentation: Secondary | ICD-10-CM | POA: Diagnosis not present

## 2021-08-04 ENCOUNTER — Other Ambulatory Visit: Payer: Self-pay

## 2021-08-04 ENCOUNTER — Encounter: Payer: Self-pay | Admitting: Neurology

## 2021-08-04 ENCOUNTER — Telehealth (INDEPENDENT_AMBULATORY_CARE_PROVIDER_SITE_OTHER): Payer: BC Managed Care – PPO | Admitting: Neurology

## 2021-08-04 VITALS — Ht 69.0 in | Wt 200.0 lb

## 2021-08-04 DIAGNOSIS — R569 Unspecified convulsions: Secondary | ICD-10-CM

## 2021-08-04 NOTE — Progress Notes (Signed)
Virtual Visit via Video Note The purpose of this virtual visit is to provide medical care while limiting exposure to the novel coronavirus.    Consent was obtained for video visit:  Yes.   Answered questions that patient had about telehealth interaction:  Yes.   I discussed the limitations, risks, security and privacy concerns of performing an evaluation and management service by telemedicine. I also discussed with the patient that there may be a patient responsible charge related to this service. The patient expressed understanding and agreed to proceed.  Pt location: Home Physician Location: office Name of referring provider:  No ref. provider found I connected with Julia Macias at patients initiation/request on 08/04/2021 at  9:30 AM EST by video enabled telemedicine application and verified that I am speaking with the correct person using two identifiers. Pt MRN:  [id number] Pt DOB:  1966/09/03 Video Participants:  Julia Macias   History of Present Illness:  The patient had a virtual video visit on 08/04/2021. She was last seen in the Neurology clinic 4 months ago for new onset nocturnal seizure that occurred 04/05/21. I personally reviewed MRI brain with and without contrast done 04/2021 which was unremarkable, hippocampi symmetric with no abnormal signal or enhancement. Her 1-hour EEG in 04/19/21 were normal. She contacted our office to report another nocturnal seizure on 06/14/21. It occurred 15 minutes after she went to sleep, her husband reported the convulsion lasted 5 minutes and took her 10-15 minutes to come out of it. No tongue bite or incontinence. There were no triggers, no sleep deprivation, she had been staying hydrated, no alcohol use. She was started on Levetiracetam 500mg  BID which she is tolerating well without side effects. She denies any seizures since 06/14/21. No staring/unresponsive episodes, gaps in time, olfactory/gustatory hallucinations, focal  numbness/tingling/weakness, myoclonic jerks. She has occasional weather-related headaches/migraines and had one this morning. She denies any dizziness, vision changes, no falls. Sleep and mood are good.    History on Initial Assessment 04/08/2021: This is a pleasant 55 year old right-handed woman with a history of ADHD presenting for new onset seizure on 04/05/21. ER notes were reviewed. She had been up for a day and a half working on her portfolio, she usually drinks a good amount of water but that day she did not drink as much. She also had a couple of glasses of wine. She recalls going to sleep around 1am, then at least 15 minutes later her husband heard a noise and found her convulsing, foaming at the mouth. Convulsion lasted 5 minutes followed by sonorous respirations. She woke up in the ambulance. She had been in the waiting room for over 8 hours and was communicating with her family with no issues. Per ER notes, when she was evaluated at 9am, she was awake, unable to give history, aphasic. This gradually improved as visit continued and she reported minimal sleep over the weekend due to a job interview that day. She and her daughter state that she had been woken up from a deep sleep and she typically has difficulty orienting herself when first waking up. She recalls feeling groggy and does not recall everything Dr. 04/07/21 had said, but does not feel she had word-finding difficulties out of the ordinary. Since then, her left hamstring has been sore and left sciatic pain flared up. She and her daughter deny any staring/unresponsive episodes, gaps in time, olfactory/gustatory hallucinations, deja vu, rising epigastric sensation, focal numbness/tingling/weakness, myoclonic jerks. She recalls having a migraine that weekend, but  nothing out of the ordinary. She has occasional migraines and sinus headaches. She denies any dizziness, vision changes, neck pain, bowel/bladder dysfunction. She had a normal birth and  early development.  There is no history of febrile convulsions, CNS infections such as meningitis/encephalitis, significant traumatic brain injury, neurosurgical procedures, or family history of seizures.  CBC, BMP in the ER were unremarkable. I personally reviewed head CT without contrast which did not show any acute changes.    Current Outpatient Medications on File Prior to Visit  Medication Sig Dispense Refill   B Complex Vitamins (VITAMIN B COMPLEX 100 IJ) vitamin B complex     Calcium Carb-Cholecalciferol (CALCIUM 1000 + D PO) calcium     levETIRAcetam (KEPPRA) 500 MG tablet Take 1 tablet (500 mg total) by mouth 2 (two) times daily. 60 tablet 5   Multiple Vitamins-Minerals (MULTIVITAMIN ADULT EXTRA C PO) multivitamin     No current facility-administered medications on file prior to visit.     Observations/Objective:   Vitals:   08/04/21 0839  Weight: 200 lb (90.7 kg)  Height: 5\' 9"  (1.753 m)   GEN:  The patient appears stated age and is in NAD.  Neurological examination: Patient is awake, alert. No aphasia or dysarthria. Intact fluency and comprehension. Cranial nerves: Extraocular movements intact with no nystagmus. No facial asymmetry. Motor: moves all extremities symmetrically, at least anti-gravity x 4.    Assessment and Plan:   This is a pleasant 55 yo RH woman with a history of ADHD, no clear epilepsy risk factors, with nocturnal seizures x 2, most recently last 06/14/21. MRI brain and 1-hour EEG normal, etiology unknown. She is now on Levetiracetam 500mg  BID without side effects. We again discussed [place] driving laws, she has had 2 nocturnal seizures and would like to request for an exemption from the 6 month restriction through the [facility] Medical Advisory Board. Paperwork will be filled out for her. Follow-up in 3 months, she knows to call for any changes.    Follow Up Instructions:   -I discussed the assessment and treatment plan with the patient. The patient was provided an  opportunity to ask questions and all were answered. The patient agreed with the plan and demonstrated an understanding of the instructions.   The patient was advised to call back or seek an in-person evaluation if the symptoms worsen or if the condition fails to improve as anticipated.    , MD

## 2021-08-04 NOTE — Patient Instructions (Signed)
Good to see you! Continue Keppra 500mg  twice a day. Follow-up in 3 months, call for any changes.   This is the website for the [facility] paperwork, please fill out your part/sign, and drop off at our office (or fax to [phone]):  [phone]   Have a Happy Thanksgiving and happy holidays! KA

## 2021-10-04 ENCOUNTER — Telehealth: Payer: Self-pay | Admitting: *Deleted

## 2021-10-04 NOTE — Telephone Encounter (Signed)
LMOM to please call back [phone] to schedule 24 hour ambulatory EEG. Dr. Karel Jarvis would like to get this done before your follow up appointment with her on February 24th. As of now the available appointment slots are   Wednesday, February 8th at 7:30 Thursday, February 9th at 7:30 Wednesday, February 15th at 11:00 Wednesday, February 22nd at 9:30  The return time to get disconnected will be 8 am for 7:30 appointments, 11:30 am for the 11:00 appointment or 10 am for the 9:30 appointment.   The February 22nd appointment is not ideal because Dr. Karel Jarvis may not have time to review the EEG before your follow up appointment on the 24th.  Looking forward to hearing from you. Darl Pikes

## 2021-10-27 ENCOUNTER — Other Ambulatory Visit: Payer: BC Managed Care – PPO

## 2021-11-03 ENCOUNTER — Other Ambulatory Visit: Payer: Self-pay

## 2021-11-03 ENCOUNTER — Ambulatory Visit (INDEPENDENT_AMBULATORY_CARE_PROVIDER_SITE_OTHER): Payer: BC Managed Care – PPO | Admitting: Neurology

## 2021-11-03 DIAGNOSIS — R569 Unspecified convulsions: Secondary | ICD-10-CM

## 2021-11-05 ENCOUNTER — Ambulatory Visit (INDEPENDENT_AMBULATORY_CARE_PROVIDER_SITE_OTHER): Payer: BC Managed Care – PPO | Admitting: Neurology

## 2021-11-05 ENCOUNTER — Encounter: Payer: Self-pay | Admitting: Neurology

## 2021-11-05 ENCOUNTER — Other Ambulatory Visit: Payer: Self-pay

## 2021-11-05 VITALS — BP 121/70 | HR 75 | Ht 69.0 in | Wt 204.6 lb

## 2021-11-05 DIAGNOSIS — R569 Unspecified convulsions: Secondary | ICD-10-CM

## 2021-11-05 MED ORDER — LEVETIRACETAM 500 MG PO TABS: 500.0000 mg | ORAL_TABLET | Freq: Two times a day (BID) | ORAL | 3 refills | Status: DC

## 2021-11-05 NOTE — Patient Instructions (Signed)
Good to see you. Continue Keppra 500mg  twice a day. Follow-up in 4 months, call for any changes.   Seizure Precautions: 1. If medication has been prescribed for you to prevent seizures, take it exactly as directed.  Do not stop taking the medicine without talking to your doctor first, even if you have not had a seizure in a long time.   2. Avoid activities in which a seizure would cause danger to yourself or to others.  Don't operate dangerous machinery, swim alone, or climb in high or dangerous places, such as on ladders, roofs, or girders.  Do not drive unless your doctor says you may.  3. If you have any warning that you may have a seizure, lay down in a safe place where you can't hurt yourself.    4.  No driving for 6 months from last seizure, as per [facility].   Please refer to the following link on the [employer]ile[employer] of [employer]'s website for more information: http://www.epilepsyfoundation.org/answerplace/Social/driving/drivingu.cfm   5.  Maintain good sleep hygiene. Avoid alcohol.  6.  Contact your doctor if you have any problems that may be related to the medicine you are taking.  7.  Call 911 and bring the patient back to the ED if:        A.  The seizure lasts longer than 5 minutes.       B.  The patient doesn't awaken shortly after the seizure  C.  The patient has new problems such as difficulty seeing, speaking or moving  D.  The patient was injured during the seizure  E.  The patient has a temperature over 102 F (39C)  F.  The patient vomited and now is having trouble breathing

## 2021-11-05 NOTE — Progress Notes (Signed)
NEUROLOGY FOLLOW UP OFFICE NOTE  Julia Macias [id number] 1966/08/07  HISTORY OF PRESENT ILLNESS: I had the pleasure of seeing Julia Macias in follow-up in the neurology clinic on 11/05/2021.  The patient was last seen 3 months ago for nocturnal seizures. She is alone in the office today. Records and images were personally reviewed where available. She had a 24-hour EEG this week which was normal, typical events not captured. She is on Levetiracetam 500mg  BID with no seizures since 06/14/2021. No staring/unresponsive episodes, gaps in time, olfactory/gustatory hallucinations, focal numbness/tingling/weakness, myoclonic jerks. No headaches, dizziness, vision changes, no falls. No side effects on LEV. Mood and sleep are good.    History on Initial Assessment 04/08/2021: This is a pleasant 56 year old right-handed woman with a history of ADHD presenting for new onset seizure on 04/05/21. ER notes were reviewed. She had been up for a day and a half working on her portfolio, she usually drinks a good amount of water but that day she did not drink as much. She also had a couple of glasses of wine. She recalls going to sleep around 1am, then at least 15 minutes later her husband heard a noise and found her convulsing, foaming at the mouth. Convulsion lasted 5 minutes followed by sonorous respirations. She woke up in the ambulance. She had been in the waiting room for over 8 hours and was communicating with her family with no issues. Per ER notes, when she was evaluated at 9am, she was awake, unable to give history, aphasic. This gradually improved as visit continued and she reported minimal sleep over the weekend due to a job interview that day. She and her daughter state that she had been woken up from a deep sleep and she typically has difficulty orienting herself when first waking up. She recalls feeling groggy and does not recall everything Dr. 04/07/21 had said, but does not feel she had word-finding  difficulties out of the ordinary. Since then, her left hamstring has been sore and left sciatic pain flared up. She and her daughter deny any staring/unresponsive episodes, gaps in time, olfactory/gustatory hallucinations, deja vu, rising epigastric sensation, focal numbness/tingling/weakness, myoclonic jerks. She recalls having a migraine that weekend, but nothing out of the ordinary. She has occasional migraines and sinus headaches. She denies any dizziness, vision changes, neck pain, bowel/bladder dysfunction. She had a normal birth and early development.  There is no history of febrile convulsions, CNS infections such as meningitis/encephalitis, significant traumatic brain injury, neurosurgical procedures, or family history of seizures.  CBC, BMP in the ER were unremarkable. I personally reviewed head CT without contrast which did not show any acute changes.  PAST MEDICAL HISTORY: History reviewed. No pertinent past medical history.  MEDICATIONS: Current Outpatient Medications on File Prior to Visit  Medication Sig Dispense Refill   B Complex Vitamins (VITAMIN B COMPLEX 100 IJ) vitamin B complex     Calcium Carb-Cholecalciferol (CALCIUM 1000 + D PO) calcium     levETIRAcetam (KEPPRA) 500 MG tablet Take 1 tablet (500 mg total) by mouth 2 (two) times daily. 60 tablet 5   Multiple Vitamins-Minerals (MULTIVITAMIN ADULT EXTRA C PO) multivitamin     No current facility-administered medications on file prior to visit.    ALLERGIES: No Known Allergies  FAMILY HISTORY: Family History  Problem Relation Age of Onset   Healthy Mother    Healthy Father     SOCIAL HISTORY: Social History   Socioeconomic History   Marital status: Married  Spouse name: Not on file   Number of children: 2   Years of education: 62   Highest education level: Not on file  Occupational History   Not on file  Tobacco Use   Smoking status: Never   Smokeless tobacco: Never  Vaping Use   Vaping Use: Never  used  Substance and Sexual Activity   Alcohol use: Yes    Comment: occ   Drug use: Never   Sexual activity: Not on file  Other Topics Concern   Not on file  Social History Narrative   Right handed      Drinks caffeine   One story home   Social Determinants of Health   Financial Resource Strain: Not on file  Food Insecurity: Not on file  Transportation Needs: Not on file  Physical Activity: Not on file  Stress: Not on file  Social Connections: Not on file  Intimate Partner Violence: Not on file     PHYSICAL EXAM: Vitals:   11/05/21 1426  BP: 121/70  Pulse: 75  SpO2: 97%   General: No acute distress Head:  Normocephalic/atraumatic Skin/Extremities: No rash, no edema Neurological Exam: alert and awake. No aphasia or dysarthria. Fund of knowledge is appropriate.  Attention and concentration are normal.   Cranial nerves: Pupils equal, round. Extraocular movements intact with no nystagmus. Visual fields full.  No facial asymmetry.  Motor: Bulk and tone normal, muscle strength 5/5 throughout with no pronator drift.   Finger to nose testing intact.  Gait narrow-based and steady, able to tandem walk adequately.  Romberg negative.   IMPRESSION: This is a pleasant 56 yo RH woman with a history of ADHD, no clear epilepsy risk factors, with 2 nocturnal seizures, most recently on 06/14/21. MRI brain, 1-hour, and 24-hour EEG normal, etiology unknown. She is doing well on Levetiracetam 500mg  BID, seizure-free since 06/2021, no side effects. She is aware of [place] driving laws to stop driving after a seizure until 6 months seizure-free. Follow-up in 4 months, call for any changes.    Thank you for allowing me to participate in her care.  Please do not hesitate to call for any questions or concerns.    07/2021, M.D.

## 2021-11-05 NOTE — Procedures (Signed)
ELECTROENCEPHALOGRAM REPORT  Dates of Recording: 11/03/2021 9:52AM to 06/04/2022 9:53AM  Patient's Name: Julia Macias MRN: [id number] Date of Birth: 1965-12-23  Referring Provider: Dr. Patrcia Dolly  Procedure: 24-hour ambulatory video EEG  History: This is a 56 year old woman with new onset nocturnal seizures in July and October 2022. EEG for classification.  Medications:  KEPRA 500MG  tablet VITAMIN B COMPLEX 100 IJ CALCIUM 1000 + D PO MULTIVITAMIN ADULT EXTRA C PO KENALOG 0.1 % cream   Technical Summary: This is a 24-hour multichannel digital video EEG recording measured by the international 10-20 system with electrodes applied with paste and impedances below 5000 ohms performed as portable with EKG monitoring.  The digital EEG was referentially recorded, reformatted, and digitally filtered in a variety of bipolar and referential montages for optimal display.    DESCRIPTION OF RECORDING: During maximal wakefulness, the background activity consisted of a symmetric 12 Hz posterior dominant rhythm which was reactive to eye opening.  There were no epileptiform discharges or focal slowing seen in wakefulness.  During the recording, the patient progresses through wakefulness, drowsiness, and Stage 2 sleep.  There is electrode artifact at C4 noted on 2/22 at 2240 hours. Again, there were no epileptiform discharges seen.  Events: There were no push button events.  There were no electrographic seizures seen.  EKG lead was unremarkable.  IMPRESSION: This 24-hour ambulatory video EEG study is normal.    CLINICAL CORRELATION: A normal EEG does not exclude a clinical diagnosis of epilepsy. Typical events were not captured. If further clinical questions remain, inpatient video EEG monitoring may be helpful.   3/22, M.D.

## 2021-11-25 ENCOUNTER — Other Ambulatory Visit: Payer: BC Managed Care – PPO

## 2022-01-18 ENCOUNTER — Telehealth: Payer: Self-pay | Admitting: Neurology

## 2022-01-18 NOTE — Telephone Encounter (Signed)
Patient said the anti seizure meds she is on is helping. She has had no issues, no seizures.but her anxiety seems to be worse. She has been using CBD oil but it is not helping. She would like to see if there is anything she can take to lessen her anxiety and irritability  ?

## 2022-01-18 NOTE — Telephone Encounter (Signed)
For mood changes related to Keppra, taking a daily vitamin B6 (pyridoxine) 50mg  tablet sometimes helps. Are there other things around her that are also contributing to increased anxiety/irritability such as more stress? Thanks ?

## 2022-01-18 NOTE — Telephone Encounter (Signed)
For mood changes related to Keppra, taking a daily vitamin B6 (pyridoxine) 50mg  tablet sometimes helps. She takes a B complex she is going to see how much B6 is in it,  Are there other things around her that are also contributing to increased anxiety/irritability such as more stress? Just life no new stress, ?

## 2022-03-04 ENCOUNTER — Ambulatory Visit: Payer: BC Managed Care – PPO | Admitting: Neurology

## 2022-03-11 ENCOUNTER — Encounter: Payer: Self-pay | Admitting: Neurology

## 2022-03-11 ENCOUNTER — Ambulatory Visit (INDEPENDENT_AMBULATORY_CARE_PROVIDER_SITE_OTHER): Payer: BC Managed Care – PPO | Admitting: Neurology

## 2022-03-11 VITALS — BP 95/62 | HR 66 | Ht 69.0 in | Wt 201.6 lb

## 2022-03-11 DIAGNOSIS — R569 Unspecified convulsions: Secondary | ICD-10-CM | POA: Diagnosis not present

## 2022-03-11 MED ORDER — LAMOTRIGINE 100 MG PO TABS: ORAL_TABLET | ORAL | 3 refills | Status: DC

## 2022-03-11 MED ORDER — LAMOTRIGINE 25 MG PO TABS: ORAL_TABLET | ORAL | 0 refills | Status: DC

## 2022-03-11 NOTE — Patient Instructions (Signed)
Good to see you.  Start Lamotrigine 25mg : Take 1 tablet twice a day for 2 weeks, then increase to 2 tablets twice a day for 2 weeks. After finishing this bottle, start Lamotrigine 100mg : take 1 tablet twice a day and continue  2. Continue Keppra 500mg  twice a day for another 8 weeks. After 2 weeks of taking the Lamotrigine 100mg  twice a day prescription, reduce Keppra to 1 tablet every night for 1 week, then reduce to 1 tablet every other night for 1 week, then stop  3. Follow-up in 2-3 months, call for any changes   Seizure Precautions: 1. If medication has been prescribed for you to prevent seizures, take it exactly as directed.  Do not stop taking the medicine without talking to your doctor first, even if you have not had a seizure in a long time.   2. Avoid activities in which a seizure would cause danger to yourself or to others.  Don't operate dangerous machinery, swim alone, or climb in high or dangerous places, such as on ladders, roofs, or girders.  Do not drive unless your doctor says you may.  3. If you have any warning that you may have a seizure, lay down in a safe place where you can't hurt yourself.    4.  No driving for 6 months from last seizure, as per [facility].   Please refer to the following link on the [employer]'s website for more information: http://www.epilepsyfoundation.org/answerplace/Social/driving/drivingu.cfm   5.  Maintain good sleep hygiene. Avoid alcohol.  6.  Contact your doctor if you have any problems that may be related to the medicine you are taking.  7.  Call 911 and bring the patient back to the ED if:        A.  The seizure lasts longer than 5 minutes.       B.  The patient doesn't awaken shortly after the seizure  C.  The patient has new problems such as difficulty seeing, speaking or moving  D.  The patient was injured during the seizure  E.  The patient has a temperature over 102 F (39C)  F.  The patient  vomited and now is having trouble breathing

## 2022-03-11 NOTE — Progress Notes (Signed)
NEUROLOGY FOLLOW UP OFFICE NOTE  Ryla Cauthon [id number] 01/06/1966  HISTORY OF PRESENT ILLNESS: I had the pleasure of seeing Birgit Nowling in follow-up in the neurology clinic on 03/11/2022.  The patient was last seen 4 months ago for nocturnal seizures. MRI brain and 24-hour EEG normal. She has been seizure-free since 06/14/2021 on Levetiracetam 500mg  BID. She denies any staring/unresponsive episodes, gaps in time, olfactory/gustatory hallucinations, focal numbness/tingling/weakness, myoclonic jerks. No significant headaches, dizziness, no falls. She has noticed some occasional balance/wooziness since starting the Levetiracetam. She has been having anger issues since starting LEV, which is unusual for her. Family have also noticed it. She started B6 supplements and increased B6 in her diet and feels it a little less, but still feels it coming on. Sleep is good.    History on Initial Assessment 04/08/2021: This is a pleasant 56 year old right-handed woman with a history of ADHD presenting for new onset seizure on 04/05/21. ER notes were reviewed. She had been up for a day and a half working on her portfolio, she usually drinks a good amount of water but that day she did not drink as much. She also had a couple of glasses of wine. She recalls going to sleep around 1am, then at least 15 minutes later her husband heard a noise and found her convulsing, foaming at the mouth. Convulsion lasted 5 minutes followed by sonorous respirations. She woke up in the ambulance. She had been in the waiting room for over 8 hours and was communicating with her family with no issues. Per ER notes, when she was evaluated at 9am, she was awake, unable to give history, aphasic. This gradually improved as visit continued and she reported minimal sleep over the weekend due to a job interview that day. She and her daughter state that she had been woken up from a deep sleep and she typically has difficulty orienting  herself when first waking up. She recalls feeling groggy and does not recall everything Dr. 04/07/21 had said, but does not feel she had word-finding difficulties out of the ordinary. Since then, her left hamstring has been sore and left sciatic pain flared up. She and her daughter deny any staring/unresponsive episodes, gaps in time, olfactory/gustatory hallucinations, deja vu, rising epigastric sensation, focal numbness/tingling/weakness, myoclonic jerks. She recalls having a migraine that weekend, but nothing out of the ordinary. She has occasional migraines and sinus headaches. She denies any dizziness, vision changes, neck pain, bowel/bladder dysfunction. She had a normal birth and early development.  There is no history of febrile convulsions, CNS infections such as meningitis/encephalitis, significant traumatic brain injury, neurosurgical procedures, or family history of seizures.  CBC, BMP in the ER were unremarkable. I personally reviewed head CT without contrast which did not show any acute changes.   PAST MEDICAL HISTORY: History reviewed. No pertinent past medical history.  MEDICATIONS: Current Outpatient Medications on File Prior to Visit  Medication Sig Dispense Refill   B Complex Vitamins (VITAMIN B COMPLEX 100 IJ) vitamin B complex     Calcium Carb-Cholecalciferol (CALCIUM 1000 + D PO) calcium     levETIRAcetam (KEPPRA) 500 MG tablet Take 1 tablet (500 mg total) by mouth 2 (two) times daily. 180 tablet 3   Multiple Vitamins-Minerals (MULTIVITAMIN ADULT EXTRA C PO) multivitamin     No current facility-administered medications on file prior to visit.    ALLERGIES: No Known Allergies  FAMILY HISTORY: Family History  Problem Relation Age of Onset   Healthy Mother  Healthy Father     SOCIAL HISTORY: Social History   Socioeconomic History   Marital status: Married    Spouse name: Not on file   Number of children: 2   Years of education: 16   Highest education level: Not  on file  Occupational History   Not on file  Tobacco Use   Smoking status: Never   Smokeless tobacco: Never  Vaping Use   Vaping Use: Never used  Substance and Sexual Activity   Alcohol use: Yes    Comment: occ   Drug use: Never   Sexual activity: Not on file  Other Topics Concern   Not on file  Social History Narrative   Right handed      Drinks caffeine   One story home   Social Determinants of Health   Financial Resource Strain: Not on file  Food Insecurity: Not on file  Transportation Needs: Not on file  Physical Activity: Not on file  Stress: Not on file  Social Connections: Not on file  Intimate Partner Violence: Not on file     PHYSICAL EXAM: Vitals:   03/11/22 0959  BP: 95/62  Pulse: 66  SpO2: 95%   General: No acute distress Head:  Normocephalic/atraumatic Skin/Extremities: No rash, no edema Neurological Exam: alert and awake. No aphasia or dysarthria. Fund of knowledge is appropriate. Attention and concentration are normal.   Cranial nerves: Pupils equal, round. Extraocular movements intact with no nystagmus. Visual fields full.  No facial asymmetry.  Motor: Bulk and tone normal, muscle strength 5/5 throughout with no pronator drift.   Finger to nose testing intact.  Gait narrow-based and steady, able to tandem walk adequately.  Romberg negative.   IMPRESSION: This is a pleasant 56 yo RH woman with a history of ADHD, no clear epilepsy risk factors, with 2 nocturnal seizures. She has been seizure-free since 06/14/21 on Levetiracetam however she has been having mood changes with medication. We discussed switching to Lamotrigine, side effects, including Levonne Spiller syndrome, were discussed. Start Lamotrigine 25mg  BID x 2 weeks,then increase to 50mg  BID x 2 weeks, then 100mg  BID. Continue Levetiracetam 500mg  BID for another 8 weeks, then she was given a tapering schedule for Levetiracetam. Discussed risk of breakthrough seizure with any medication adjustment.  She is aware of [place] driving laws to stop driving after a seizure until 6 months seizure-free. Follow-up in 3 months, call for any changes.    Thank you for allowing me to participate in her care.  Please do not hesitate to call for any questions or concerns.    , M.D.

## 2022-03-16 DIAGNOSIS — L821 Other seborrheic keratosis: Secondary | ICD-10-CM | POA: Diagnosis not present

## 2022-04-05 ENCOUNTER — Other Ambulatory Visit: Payer: Self-pay | Admitting: Neurology

## 2022-05-02 DIAGNOSIS — M79662 Pain in left lower leg: Secondary | ICD-10-CM | POA: Diagnosis not present

## 2022-05-02 DIAGNOSIS — S8012XA Contusion of left lower leg, initial encounter: Secondary | ICD-10-CM | POA: Diagnosis not present

## 2022-06-02 DIAGNOSIS — Z01419 Encounter for gynecological examination (general) (routine) without abnormal findings: Secondary | ICD-10-CM | POA: Diagnosis not present

## 2022-06-02 DIAGNOSIS — Z6829 Body mass index (BMI) 29.0-29.9, adult: Secondary | ICD-10-CM | POA: Diagnosis not present

## 2022-06-02 DIAGNOSIS — Z1231 Encounter for screening mammogram for malignant neoplasm of breast: Secondary | ICD-10-CM | POA: Diagnosis not present

## 2022-06-08 ENCOUNTER — Ambulatory Visit (INDEPENDENT_AMBULATORY_CARE_PROVIDER_SITE_OTHER): Payer: BC Managed Care – PPO | Admitting: Neurology

## 2022-06-08 ENCOUNTER — Encounter: Payer: Self-pay | Admitting: Neurology

## 2022-06-08 VITALS — BP 105/68 | HR 70 | Ht 69.0 in | Wt 199.0 lb

## 2022-06-08 DIAGNOSIS — R569 Unspecified convulsions: Secondary | ICD-10-CM

## 2022-06-08 MED ORDER — LAMOTRIGINE ER 100 MG PO TB24: ORAL_TABLET | ORAL | 3 refills | Status: DC

## 2022-06-08 NOTE — Patient Instructions (Signed)
Hopefully symptoms will improve with medication adjustment: Reduce Lamotrigine 100mg : take 1/2 tablet in AM, 1 tablet in PM for a week, then switch to extended-release (ER) Lamotrigine 100mg : take 1 tablet every night.  Please update me on MyChart after 2 weeks of switching to ER formulation.  Follow-up in 3 months, call for any changes   Seizure Precautions: 1. If medication has been prescribed for you to prevent seizures, take it exactly as directed.  Do not stop taking the medicine without talking to your doctor first, even if you have not had a seizure in a long time.   2. Avoid activities in which a seizure would cause danger to yourself or to others.  Don't operate dangerous machinery, swim alone, or climb in high or dangerous places, such as on ladders, roofs, or girders.  Do not drive unless your doctor says you may.  3. If you have any warning that you may have a seizure, lay down in a safe place where you can't hurt yourself.    4.  No driving for 6 months from last seizure, as per [facility].   Please refer to the following link on the [facility] website for more information: http://www.epilepsyfoundation.org/answerplace/Social/driving/drivingu.cfm   5.  Maintain good sleep hygiene. Avoid alcohol.  6.  Contact your doctor if you have any problems that may be related to the medicine you are taking.  7.  Call 911 and bring the patient back to the ED if:        A.  The seizure lasts longer than 5 minutes.       B.  The patient doesn't awaken shortly after the seizure  C.  The patient has new problems such as difficulty seeing, speaking or moving  D.  The patient was injured during the seizure  E.  The patient has a temperature over 102 F (39C)  F.  The patient vomited and now is having trouble breathing

## 2022-06-08 NOTE — Progress Notes (Signed)
NEUROLOGY FOLLOW UP OFFICE NOTE  Julia Buddenhagen [id number] [birth year]  HISTORY OF PRESENT ILLNESS: I had the pleasure of seeing Julia Macias in follow-up in the neurology clinic on 06/08/2022.  The patient was last seen 3 months ago for nocturnal seizures. MRI brain and 24-hour EEG normal. On her last visit, she reported anger issues with Levetiracetam and was switched to Lamotrigine, currently on 100mg  BID. She has weaned off Levetiracetam. She continues to deny any seizures or seizure-like symptoms since 06/14/2021. She reports her mood is awesome, the anger issues have completely resolved. She however is having several new symptoms that are very bothersome. She has noticed she has developed a tic of some sort where she has the urge to repeatedly press on the edge of her thumb. She has noticed a little heaviness in her chest. She is more clumsy and fell 6.5 weeks ago. Balance is off sometimes when she stands up. Vision is definitely different but not very bothersome, in the mornings before she takes her morning dose, vision is blurred. She has noticed stiffness, mostly in her right hand where she has a hard time to make a fist. She was having cognitive issues, which worried her due to her mother's history of Alzheimer's, she would go to the cabinet and go to the wrong one. This has gotten better. Sleep has changed, she cannot sleep when normally she is a really good sleeper. She has been taking melatonin every few nights, which does help her sleep through the night. She continues to take the pyridoxine because she feels it helps her cognitively as well.    History on Initial Assessment 04/08/2021: This is a pleasant 56 year old right-handed woman with a history of ADHD presenting for new onset seizure on 04/05/21. ER notes were reviewed. She had been up for a day and a half working on her portfolio, she usually drinks a good amount of water but that day she did not drink as much. She also had a  couple of glasses of wine. She recalls going to sleep around 1am, then at least 15 minutes later her husband heard a noise and found her convulsing, foaming at the mouth. Convulsion lasted 5 minutes followed by sonorous respirations. She woke up in the ambulance. She had been in the waiting room for over 8 hours and was communicating with her family with no issues. Per ER notes, when she was evaluated at [facility], she was awake, unable to give history, aphasic. This gradually improved as visit continued and she reported minimal sleep over the weekend due to a job interview that day. She and her daughter state that she had been woken up from a deep sleep and she typically has difficulty orienting herself when first waking up. She recalls feeling groggy and does not recall everything Dr. Eulis Foster had said, but does not feel she had word-finding difficulties out of the ordinary. Since then, her left hamstring has been sore and left sciatic pain flared up. She and her daughter deny any staring/unresponsive episodes, gaps in time, olfactory/gustatory hallucinations, deja vu, rising epigastric sensation, focal numbness/tingling/weakness, myoclonic jerks. She recalls having a migraine that weekend, but nothing out of the ordinary. She has occasional migraines and sinus headaches. She denies any dizziness, vision changes, neck pain, bowel/bladder dysfunction. She had a normal birth and early development.  There is no history of febrile convulsions, CNS infections such as meningitis/encephalitis, significant traumatic brain injury, neurosurgical procedures, or family history of seizures.  CBC, BMP in the  ER were unremarkable. I personally reviewed head CT without contrast which did not show any acute changes.  Prior ASMs: Levetiracetam (anger issues)   PAST MEDICAL HISTORY: History reviewed. No pertinent past medical history.  MEDICATIONS: Current Outpatient Medications on File Prior to Visit  Medication Sig Dispense  Refill   Calcium Carb-Cholecalciferol (CALCIUM 1000 + D PO) calcium     lamoTRIgine (LAMICTAL) 100 MG tablet After finishing prescription for Lamotrigine 25mg  tablets, start Lamotrigine 100mg : Take 1 tablet twice a day 180 tablet 3   magnesium 30 MG tablet Take 30 mg by mouth daily.     Melatonin-Pyridoxine (MELATIN PO) Take by mouth.     Multiple Vitamins-Minerals (MULTIVITAMIN ADULT EXTRA C PO) multivitamin     pyridOXINE (VITAMIN B-6) 100 MG tablet Take 100 mg by mouth daily.     No current facility-administered medications on file prior to visit.    ALLERGIES: No Known Allergies  FAMILY HISTORY: Family History  Problem Relation Age of Onset   Healthy Mother    Healthy Father     SOCIAL HISTORY: Social History   Socioeconomic History   Marital status: Married    Spouse name: Not on file   Number of children: 2   Years of education: 16   Highest education level: Not on file  Occupational History   Not on file  Tobacco Use   Smoking status: Never   Smokeless tobacco: Never  Vaping Use   Vaping Use: Never used  Substance and Sexual Activity   Alcohol use: Yes    Comment: occ   Drug use: Never   Sexual activity: Not on file  Other Topics Concern   Not on file  Social History Narrative   Right handed      Drinks caffeine   One story home   Social Determinants of Health   Financial Resource Strain: Not on file  Food Insecurity: Not on file  Transportation Needs: Not on file  Physical Activity: Not on file  Stress: Not on file  Social Connections: Not on file  Intimate Partner Violence: Not on file     PHYSICAL EXAM: Vitals:   06/08/22 0933  BP: 105/68  Pulse: 70  SpO2: 98%   General: No acute distress Head:  Normocephalic/atraumatic Skin/Extremities: No rash, no edema Neurological Exam: alert and awake. No aphasia or dysarthria. Fund of knowledge is appropriate. Attention and concentration are normal.   Cranial nerves: Pupils equal, round.  Extraocular movements intact with no nystagmus. Visual fields full.  No facial asymmetry.  Motor: Bulk and tone normal, muscle strength 5/5 throughout with no pronator drift.   Finger to nose testing intact.  Gait narrow-based and steady, mild difficulty with tandem walk. Romberg negative.   IMPRESSION: This is a pleasant 56 yo RH woman with a history of ADHD, no clear epilepsy risk factors, with 2 nocturnal seizures. No seizures since 06/2021. Anger issues resolved with discontinuation of Levetiracetam, however she is having side effects on immediate-release Lamotrigine, she had a fall 6 weeks ago. We discussed reducing to 50mg  in AM, 100mg  in PM for 1 week, then switching to extended-release Lamotrigine 100mg  qhs since her seizures have all been nocturnal. Continue to monitor side effects with medication adjustments, she will update our office in 3 weeks. She is aware of [place] driving laws to stop driving after a seizure until 6 months seizure-free. Follow-up in 3 months, call for any changes.     Thank you for allowing me to participate in her  care.  Please do not hesitate to call for any questions or concerns.    Ellouise Newer, M.D.

## 2022-07-26 DIAGNOSIS — L814 Other melanin hyperpigmentation: Secondary | ICD-10-CM | POA: Diagnosis not present

## 2022-07-26 DIAGNOSIS — M71372 Other bursal cyst, left ankle and foot: Secondary | ICD-10-CM | POA: Diagnosis not present

## 2022-07-26 DIAGNOSIS — L821 Other seborrheic keratosis: Secondary | ICD-10-CM | POA: Diagnosis not present

## 2022-07-26 DIAGNOSIS — L720 Epidermal cyst: Secondary | ICD-10-CM | POA: Diagnosis not present

## 2022-07-26 DIAGNOSIS — D225 Melanocytic nevi of trunk: Secondary | ICD-10-CM | POA: Diagnosis not present

## 2022-09-07 ENCOUNTER — Ambulatory Visit: Payer: BC Managed Care – PPO | Admitting: Neurology

## 2022-09-23 ENCOUNTER — Ambulatory Visit: Payer: BC Managed Care – PPO | Admitting: Neurology

## 2022-12-14 ENCOUNTER — Telehealth: Payer: Self-pay | Admitting: Neurology

## 2022-12-14 MED ORDER — LAMOTRIGINE 25 MG PO TABS: ORAL_TABLET | ORAL | 1 refills | Status: DC

## 2022-12-14 NOTE — Telephone Encounter (Signed)
Pt called in stating her Lamotrigine had been back ordered and she finally went to go get it Friday. They told her it was over $400. She is out now and ended up taking her old prescription of Lamotrigine that she still had leftovers of. Since Friday she has taken a half in the morning and a half at night. She is not sure what to do from here?

## 2022-12-14 NOTE — Telephone Encounter (Signed)
Left message and sent rx into pharmacy per Dr.Adam [facility]

## 2022-12-19 ENCOUNTER — Telehealth: Payer: Self-pay

## 2022-12-19 ENCOUNTER — Other Ambulatory Visit (HOSPITAL_COMMUNITY): Payer: Self-pay

## 2022-12-19 MED ORDER — LAMOTRIGINE ER 100 MG PO TB24: ORAL_TABLET | ORAL | 11 refills | Status: DC

## 2022-12-19 NOTE — Telephone Encounter (Signed)
Patient Advocate Encounter   Received notification from MD office/Patient Pharmc[facility] that prior authorization is required for lamoTRIgine (LAMICTAL XR) 100 MG 24 hour tablet   Submitted: n/a Key n/a  No prior authorization submitted at this time

## 2022-12-19 NOTE — Telephone Encounter (Signed)
She is not on brand. Agree with Julia Macias, pls let her know to sign up and I will send in Rx for Lamotrigine ER 100mg : take 1 tablet every night. It says price is $14 for 30 tablets.

## 2022-12-21 ENCOUNTER — Other Ambulatory Visit (HOSPITAL_COMMUNITY): Payer: Self-pay

## 2023-03-28 ENCOUNTER — Encounter: Payer: Self-pay | Admitting: Neurology

## 2023-03-28 ENCOUNTER — Telehealth: Payer: Self-pay | Admitting: Neurology

## 2023-03-28 NOTE — Telephone Encounter (Signed)
Patient advised to send my chart message, she said she would.

## 2023-03-28 NOTE — Telephone Encounter (Signed)
Can you pls ask patient to send me MyChart message about her concerns? Thanks

## 2023-03-28 NOTE — Telephone Encounter (Signed)
Patient wants to talk with aquino about lowering her lamotrigine. She has been put on wait list

## 2023-03-30 ENCOUNTER — Encounter: Payer: Self-pay | Admitting: Neurology

## 2023-03-30 ENCOUNTER — Ambulatory Visit (INDEPENDENT_AMBULATORY_CARE_PROVIDER_SITE_OTHER): Payer: BC Managed Care – PPO | Admitting: Neurology

## 2023-03-30 VITALS — BP 107/69 | HR 78 | Ht 69.0 in | Wt 202.0 lb

## 2023-03-30 DIAGNOSIS — R569 Unspecified convulsions: Secondary | ICD-10-CM | POA: Diagnosis not present

## 2023-03-30 MED ORDER — LAMOTRIGINE ER 100 MG PO TB24: ORAL_TABLET | ORAL | 6 refills | Status: DC

## 2023-03-30 NOTE — Progress Notes (Signed)
NEUROLOGY FOLLOW UP OFFICE NOTE  Elanna Bert [id number] 12-21-1965  HISTORY OF PRESENT ILLNESS: I had the pleasure of seeing Shanyah Gattuso in follow-up in the neurology clinic on 03/30/2023.  The patient was last seen 10 months ago for nocturnal seizures. She has had 2 nocturnal seizures, in 03/2021 and 06/2021 which she feels were related to the Covid vaccine. She had anger issues that resolved off Levetiracetam, she was switched to Lamotrigine, however she started having a tic, chest heaviness, balance changes with falls. Dose was reduced and she was switched to extended-release to take at bedtime, however it appears her pharmacy switched her to 25mg  2 tabs BID without contacting our office. She contacted our office about continued side effects, she feels unsteady and has intermittent palpitations.Every once in a while she has weird pounding sensations in her chest lasting a few minutes, she sits and the feeling passes. No associated confusion. She continues to note clumsiness and imbalance, tripping over her feet, which is unusual for her. She has episodes of a little fuzziness with short term memory changes. She notes her eyes have been weird as well. She denies any headaches, no falls. No staring/unresponsive episodes, gaps in time, myoclonic jerks.     History on Initial Assessment 04/08/2021: This is a pleasant 57 year old right-handed woman with a history of ADHD presenting for new onset seizure on 04/05/21. ER notes were reviewed. She had been up for a day and a half working on her portfolio, she usually drinks a good amount of water but that day she did not drink as much. She also had a couple of glasses of wine. She recalls going to sleep around 1am, then at least 15 minutes later her husband heard a noise and found her convulsing, foaming at the mouth. Convulsion lasted 5 minutes followed by sonorous respirations. She woke up in the ambulance. She had been in the waiting room for  over 8 hours and was communicating with her family with no issues. Per ER notes, when she was evaluated at 9am, she was awake, unable to give history, aphasic. This gradually improved as visit continued and she reported minimal sleep over the weekend due to a job interview that day. She and her daughter state that she had been woken up from a deep sleep and she typically has difficulty orienting herself when first waking up. She recalls feeling groggy and does not recall everything Dr. Effie Shy had said, but does not feel she had word-finding difficulties out of the ordinary. Since then, her left hamstring has been sore and left sciatic pain flared up. She and her daughter deny any staring/unresponsive episodes, gaps in time, olfactory/gustatory hallucinations, deja vu, rising epigastric sensation, focal numbness/tingling/weakness, myoclonic jerks. She recalls having a migraine that weekend, but nothing out of the ordinary. She has occasional migraines and sinus headaches. She denies any dizziness, vision changes, neck pain, bowel/bladder dysfunction. She had a normal birth and early development.  There is no history of febrile convulsions, CNS infections such as meningitis/encephalitis, significant traumatic brain injury, neurosurgical procedures, or family history of seizures.  CBC, BMP in the ER were unremarkable. I personally reviewed head CT without contrast which did not show any acute changes.  Update 08/04/2021: She contacted our office to report another nocturnal seizure on 06/14/21. It occurred 15 minutes after she went to sleep, her husband reported the convulsion lasted 5 minutes and took her 10-15 minutes to come out of it. No tongue bite or incontinence. There were  no triggers, no sleep deprivation, she had been staying hydrated, no alcohol use. She was started on Levetiracetam 500mg  BID which she is tolerating well without side effects. She denies any seizures since 06/14/21.   Prior ASMs:  Levetiracetam (anger issues)  PAST MEDICAL HISTORY: History reviewed. No pertinent past medical history.  MEDICATIONS: Current Outpatient Medications on File Prior to Visit  Medication Sig Dispense Refill   Calcium Carb-Cholecalciferol (CALCIUM 1000 + D PO) calcium     lamoTRIgine (LAMICTAL) 25 MG tablet Take 50 mg by mouth 2 (two) times daily.     magnesium 30 MG tablet Take 30 mg by mouth daily.     Melatonin-Pyridoxine (MELATIN PO) Take by mouth.     Multiple Vitamins-Minerals (MULTIVITAMIN ADULT EXTRA C PO) multivitamin     vitamin B-12 (CYANOCOBALAMIN) 100 MCG tablet Take 100 mcg by mouth daily.     No current facility-administered medications on file prior to visit.    ALLERGIES: No Known Allergies  FAMILY HISTORY: Family History  Problem Relation Age of Onset   Healthy Mother    Healthy Father     SOCIAL HISTORY: Social History   Socioeconomic History   Marital status: Married    Spouse name: Not on file   Number of children: 2   Years of education: 16   Highest education level: Not on file  Occupational History   Not on file  Tobacco Use   Smoking status: Never   Smokeless tobacco: Never  Vaping Use   Vaping status: Never Used  Substance and Sexual Activity   Alcohol use: Yes    Comment: occ   Drug use: Never   Sexual activity: Not on file  Other Topics Concern   Not on file  Social History Narrative   Right handed      Drinks caffeine   One story home   Social Determinants of Health   Financial Resource Strain: Not on file  Food Insecurity: Not on file  Transportation Needs: Not on file  Physical Activity: Not on file  Stress: Not on file  Social Connections: Not on file  Intimate Partner Violence: Not on file     PHYSICAL EXAM: Vitals:   03/30/23 1047  BP: 107/69  Pulse: 78  SpO2: 97%   General: No acute distress Head:  Normocephalic/atraumatic Skin/Extremities: No rash, no edema Neurological Exam: alert and awake. No aphasia or  dysarthria. Fund of knowledge is appropriate.   Attention and concentration are normal.   Cranial nerves: Pupils equal, round. Extraocular movements intact with no nystagmus. Visual fields full.  No facial asymmetry.  Motor: Bulk and tone normal, muscle strength 5/5 throughout with no pronator drift.   Finger to nose testing intact.  Gait narrow-based and steady, able to tandem walk adequately.  Romberg negative.   IMPRESSION: This is a pleasant 57 yo RH woman with a history of ADHD, no clear epilepsy risk factors, with 2 nocturnal seizures, in July 2022 and October 2022. She was started on seizure medication after the second seizure however has had side effects on Levetiracetam and immediate-release Lamotrigine. She was not able to switch to extended-release, we discussed taking 100mg  at bedtime and monitoring if side effects improve. She will update our office in a month. If she does better and continues with no seizures, she is interested in reducing dose to 50mg  at bedtime. If she has issues on extended-release formulation, would wean off and repeat EEG. If normal, we can monitor symptoms and if seizures recur,  start a different seizure medication. She is aware of [place] driving laws to stop driving after a seizure until 6 months seizure-free. Follow-up in 3 months, call for any changes.   Thank you for allowing me to participate in her care.  Please do not hesitate to call for any questions or concerns.   Patrcia Dolly, M.D.

## 2023-03-30 NOTE — Patient Instructions (Addendum)
Good to see you.  A prescription has been sent to [facility] pharmacy for Lamotrigine ER 100mg : Take 1 tablet every night  2. Sign up online to the pharmacy   3. Update me in 1 month  4. Follow-up as scheduled in October, call for any changes   Seizure Precautions: 1. If medication has been prescribed for you to prevent seizures, take it exactly as directed.  Do not stop taking the medicine without talking to your doctor first, even if you have not had a seizure in a long time.   2. Avoid activities in which a seizure would cause danger to yourself or to others.  Don't operate dangerous machinery, swim alone, or climb in high or dangerous places, such as on ladders, roofs, or girders.  Do not drive unless your doctor says you may.  3. If you have any warning that you may have a seizure, lay down in a safe place where you can't hurt yourself.    4.  No driving for 6 months from last seizure, as per [facility].   Please refer to the following link on the Epilepsy Foundation of America's website for more information: http://www.epilepsyfoundation.org/answerplace/Social/driving/drivingu.cfm   5.  Maintain good sleep hygiene. Avoid alcohol.  6.  Contact your doctor if you have any problems that may be related to the medicine you are taking.  7.  Call 911 and bring the patient back to the ED if:        A.  The seizure lasts longer than 5 minutes.       B.  The patient doesn't awaken shortly after the seizure  C.  The patient has new problems such as difficulty seeing, speaking or moving  D.  The patient was injured during the seizure  E.  The patient has a temperature over 102 F (39C)  F.  The patient vomited and now is having trouble breathing

## 2023-03-30 NOTE — Telephone Encounter (Signed)
Appt scheduled 7/18.

## 2023-05-12 ENCOUNTER — Telehealth: Payer: Self-pay | Admitting: Neurology

## 2023-05-12 MED ORDER — LAMOTRIGINE ER 50 MG PO TB24: ORAL_TABLET | ORAL | 5 refills | Status: AC

## 2023-05-12 NOTE — Telephone Encounter (Signed)
LamoTRIgineXR 100mg  , Pt wants to try 50mg  then get off

## 2023-05-12 NOTE — Telephone Encounter (Signed)
Pt called an informed that Rx for Lamictal XR 50mg  daily sent to [facility]. Continue for a month and we can discuss further instructions if no issues.

## 2023-05-12 NOTE — Telephone Encounter (Signed)
Rx for Lamictal XR 50mg  daily sent to [facility]. Continue for a month and we can discuss further instructions if no issues. Thanks

## 2023-06-14 DIAGNOSIS — Z13228 Encounter for screening for other metabolic disorders: Secondary | ICD-10-CM | POA: Diagnosis not present

## 2023-06-14 DIAGNOSIS — Z1329 Encounter for screening for other suspected endocrine disorder: Secondary | ICD-10-CM | POA: Diagnosis not present

## 2023-06-14 DIAGNOSIS — Z124 Encounter for screening for malignant neoplasm of cervix: Secondary | ICD-10-CM | POA: Diagnosis not present

## 2023-06-14 DIAGNOSIS — Z01419 Encounter for gynecological examination (general) (routine) without abnormal findings: Secondary | ICD-10-CM | POA: Diagnosis not present

## 2023-06-14 DIAGNOSIS — Z1151 Encounter for screening for human papillomavirus (HPV): Secondary | ICD-10-CM | POA: Diagnosis not present

## 2023-06-14 DIAGNOSIS — Z13220 Encounter for screening for lipoid disorders: Secondary | ICD-10-CM | POA: Diagnosis not present

## 2023-06-14 DIAGNOSIS — Z130 Encounter for screening for diseases of the blood and blood-forming organs and certain disorders involving the immune mechanism: Secondary | ICD-10-CM | POA: Diagnosis not present

## 2023-06-14 DIAGNOSIS — Z6829 Body mass index (BMI) 29.0-29.9, adult: Secondary | ICD-10-CM | POA: Diagnosis not present

## 2023-06-14 DIAGNOSIS — Z1231 Encounter for screening mammogram for malignant neoplasm of breast: Secondary | ICD-10-CM | POA: Diagnosis not present

## 2023-06-23 ENCOUNTER — Ambulatory Visit: Payer: BC Managed Care – PPO | Admitting: Neurology

## 2023-06-23 DIAGNOSIS — Z1329 Encounter for screening for other suspected endocrine disorder: Secondary | ICD-10-CM | POA: Diagnosis not present

## 2023-06-23 DIAGNOSIS — Z130 Encounter for screening for diseases of the blood and blood-forming organs and certain disorders involving the immune mechanism: Secondary | ICD-10-CM | POA: Diagnosis not present

## 2023-06-23 DIAGNOSIS — Z131 Encounter for screening for diabetes mellitus: Secondary | ICD-10-CM | POA: Diagnosis not present

## 2023-06-23 DIAGNOSIS — Z1321 Encounter for screening for nutritional disorder: Secondary | ICD-10-CM | POA: Diagnosis not present

## 2023-06-23 DIAGNOSIS — Z13228 Encounter for screening for other metabolic disorders: Secondary | ICD-10-CM | POA: Diagnosis not present

## 2023-06-23 DIAGNOSIS — Z13220 Encounter for screening for lipoid disorders: Secondary | ICD-10-CM | POA: Diagnosis not present

## 2023-08-03 DIAGNOSIS — L814 Other melanin hyperpigmentation: Secondary | ICD-10-CM | POA: Diagnosis not present

## 2023-08-03 DIAGNOSIS — L821 Other seborrheic keratosis: Secondary | ICD-10-CM | POA: Diagnosis not present

## 2023-08-03 DIAGNOSIS — L570 Actinic keratosis: Secondary | ICD-10-CM | POA: Diagnosis not present

## 2023-08-03 DIAGNOSIS — D225 Melanocytic nevi of trunk: Secondary | ICD-10-CM | POA: Diagnosis not present

## 2023-09-11 ENCOUNTER — Telehealth: Payer: BC Managed Care – PPO | Admitting: Physician Assistant

## 2023-09-11 DIAGNOSIS — G43511 Persistent migraine aura without cerebral infarction, intractable, with status migrainosus: Secondary | ICD-10-CM | POA: Diagnosis not present

## 2023-09-11 MED ORDER — SUMATRIPTAN SUCCINATE 50 MG PO TABS
50.0000 mg | ORAL_TABLET | ORAL | 0 refills | Status: AC | PRN
Start: 2023-09-11 — End: ?

## 2023-09-11 NOTE — Patient Instructions (Signed)
Hubert Azure, thank you for joining Margaretann Loveless, PA-C for today's virtual visit.  While this provider is not your primary care provider (PCP), if your PCP is located in our provider database this encounter information will be shared with them immediately following your visit.   A [facility] MyChart account gives you access to today's visit and all your visits, tests, and labs performed at [facility] " click here if you don't have a [facility] MyChart account or go to [email or web address]  Consent: (Patient) Julia Macias provided verbal consent for this virtual visit at the beginning of the encounter.  Current Medications:  Current Outpatient Medications:    SUMAtriptan (IMITREX) 50 MG tablet, Take 1 tablet (50 mg total) by mouth every 2 (two) hours as needed for migraine. May repeat in 2 hours if headache persists or recurs., Disp: 10 tablet, Rfl: 0   Calcium Carb-Cholecalciferol (CALCIUM 1000 + D PO), calcium, Disp: , Rfl:    lamoTRIgine (LAMICTAL XR) 50 MG 24 hour tablet, Take 1 tablet every night, Disp: 30 tablet, Rfl: 5   magnesium 30 MG tablet, Take 30 mg by mouth daily., Disp: , Rfl:    Melatonin-Pyridoxine (MELATIN PO), Take by mouth., Disp: , Rfl:    Multiple Vitamins-Minerals (MULTIVITAMIN ADULT EXTRA C PO), multivitamin, Disp: , Rfl:    vitamin B-12 (CYANOCOBALAMIN) 100 MCG tablet, Take 100 mcg by mouth daily., Disp: , Rfl:    Medications ordered in this encounter:  Meds ordered this encounter  Medications   SUMAtriptan (IMITREX) 50 MG tablet    Sig: Take 1 tablet (50 mg total) by mouth every 2 (two) hours as needed for migraine. May repeat in 2 hours if headache persists or recurs.    Dispense:  10 tablet    Refill:  0    Supervising Provider:   Merrilee Jansky [[id number]]     *If you need refills on other medications prior to your next appointment, please contact your pharmacy*  Follow-Up: Call back or seek an in-person evaluation  if the symptoms worsen or if the condition fails to improve as anticipated.  [facility] [phone]  Other Instructions Migraine Headache A migraine headache is an intense pulsing or throbbing pain on one or both sides of the head. Migraine headaches may also cause other symptoms, such as nausea, vomiting, and sensitivity to light and noise. A migraine headache can last from 4 hours to 3 days. Talk with your health care provider about what things may bring on (trigger) your migraine headaches. What are the causes? The exact cause is not known. However, a migraine may be caused when nerves in the brain get irritated and release chemicals that cause blood vessels to become inflamed. This inflammation causes pain. Migraines may be triggered or caused by: Smoking. Medicines, such as: Nitroglycerin, which is used to treat chest pain. Birth control pills. Estrogen. Certain blood pressure medicines. Foods or drinks that contain nitrates, glutamate, aspartame, MSG, or tyramine. Certain foods or drinks, such as aged cheeses, chocolate, alcohol, or caffeine. Doing physical activity that is very hard. Other triggers may include: Menstruation. Pregnancy. Hunger. Stress. Getting too much or too little sleep. Weather changes. Tiredness (fatigue). What increases the risk? The following factors may make you more likely to have migraine headaches: Being between the ages of 51-80 years old. Being female. Having a family history of migraine headaches. Being Caucasian. Having a mental health condition, such as depression or anxiety. Being obese. What are the signs  or symptoms? The main symptom of this condition is pulsing or throbbing pain. This pain may: Happen in any area of the head, such as on one or both sides. Make it hard to do daily activities. Get worse with physical activity. Get worse around bright lights, loud noises, or smells. Other symptoms may  include: Nausea. Vomiting. Dizziness. Before a migraine headache starts, you may get warning signs (an aura). An aura may include: Seeing flashing lights or having blind spots. Seeing bright spots, halos, or zigzag lines. Having tunnel vision or blurred vision. Having numbness or a tingling feeling. Having trouble talking. Having muscle weakness. After a migraine ends, you may have symptoms. These may include: Feeling tired. Trouble concentrating. How is this diagnosed? A migraine headache can be diagnosed based on: Your symptoms. A physical exam. Tests, such as: A CT scan or an MRI of the head. These tests can help rule out other causes of headaches. Taking fluid from the spine (lumbar puncture) to examine it (cerebrospinal fluid analysis, or CSF analysis). How is this treated? This condition may be treated with medicines that: Relieve pain and nausea. Prevent migraines. Treatment may also include: Acupuncture. Lifestyle changes like avoiding foods that trigger migraine headaches. Learning ways to control your body (biofeedback). Talk therapy to help you know and deal with negative thoughts (cognitive behavioral therapy). Follow these instructions at home: Medicines Take over-the-counter and prescription medicines only as told by your provider. Ask your provider if the medicine prescribed to you: Requires you to avoid driving or using machinery. Can cause constipation. You may need to take these actions to prevent or treat constipation: Drink enough fluid to keep your pee (urine) pale yellow. Take over-the-counter or prescription medicines. Eat foods that are high in fiber, such as beans, whole grains, and fresh fruits and vegetables. Limit foods that are high in fat and processed sugars, such as fried or sweet foods. Lifestyle  Do not drink alcohol. Do not use any products that contain nicotine or tobacco. These products include cigarettes, chewing tobacco, and vaping  devices, such as e-cigarettes. If you need help quitting, ask your provider. Get 7-9 hours of sleep each night, or the amount recommended by your provider. Find ways to manage stress, such as meditation, deep breathing, or yoga. Try to exercise regularly. This can help lessen how bad and how often your migraines occur. General instructions Keep a journal to find out what triggers your migraines, so you can avoid those things. For example, write down: What you eat and drink. How much sleep you get. Any change to your diet or medicines. If you have a migraine headache: Avoid things that make your symptoms worse, such as bright lights. Lie down in a dark, quiet room. Do not drive or use machinery. Ask your provider what activities are safe for you while you have symptoms. Keep all follow-up visits. Your provider will monitor your symptoms and recommend any further treatment. Where to find more information Coalition for Headache and Migraine Patients (CHAMP): headachemigraine.org American Migraine Foundation: americanmigrainefoundation.org National Headache Foundation: headaches.org Contact a health care provider if: You have symptoms that are different or worse than your usual migraine headache symptoms. You have more than 15 days of headaches in one month. Get help right away if: Your migraine headache becomes severe or lasts more than 72 hours. You have a fever or stiff neck. You have vision loss. Your muscles feel weak or like you cannot control them. You lose your balance often or have trouble  walking. You faint. You have a seizure. This information is not intended to replace advice given to you by your health care provider. Make sure you discuss any questions you have with your health care provider. Document Revised: 04/25/2022 Document Reviewed: 04/25/2022 Elsevier Patient Education  2024 Elsevier Inc.    If you have been instructed to have an in-person evaluation today at a  local Urgent Care facility, please use the link below. It will take you to a list of all of our available [facility], including address, phone number and hours of operation. Please do not delay care.  [facility]  If you or a family member do not have a primary care provider, use the link below to schedule a visit and establish care. When you choose a [facility] primary care physician or advanced practice provider, you gain a long-term partner in health. Find a Primary Care Provider  Learn more about [facility]'s in-office and virtual care options: [facility] - Get Care Now

## 2023-09-11 NOTE — Progress Notes (Signed)
Virtual Visit Consent   [name], you are scheduled for a virtual visit with a [facility] provider today. Just as with appointments in the office, your consent must be obtained to participate. Your consent will be active for this visit and any virtual visit you may have with one of our providers in the next 365 days. If you have a MyChart account, a copy of this consent can be sent to you electronically.  As this is a virtual visit, video technology does not allow for your provider to perform a traditional examination. This may limit your provider's ability to fully assess your condition. If your provider identifies any concerns that need to be evaluated in person or the need to arrange testing (such as labs, EKG, etc.), we will make arrangements to do so. Although advances in technology are sophisticated, we cannot ensure that it will always work on either your end or our end. If the connection with a video visit is poor, the visit may have to be switched to a telephone visit. With either a video or telephone visit, we are not always able to ensure that we have a secure connection.  By engaging in this virtual visit, you consent to the provision of healthcare and authorize for your insurance to be billed (if applicable) for the services provided during this visit. Depending on your insurance coverage, you may receive a charge related to this service.  I need to obtain your verbal consent now. Are you willing to proceed with your visit today? [name] has provided verbal consent on 09/11/2023 for a virtual visit (video or telephone). Margaretann Loveless, PA-C  Date: 09/11/2023 10:12 AM  Virtual Visit via Video Note   I, Margaretann Loveless, connected with  [name]  ([id number], 1966-08-22) on 09/11/23 at 10:15 AM EST by a video-enabled telemedicine application and verified that I am speaking with the correct person using two identifiers.  Location: Patient: Virtual  Visit Location Patient: Home Provider: Virtual Visit Location Provider: Home Office   I discussed the limitations of evaluation and management by telemedicine and the availability of in person appointments. The patient expressed understanding and agreed to proceed.    History of Present Illness: Julia Macias is a 57 y.o. who identifies as a female who was assigned female at birth, and is being seen today for migraine.  HPI: Migraine  This is a new problem. The current episode started in the past 7 days (3 days). The problem occurs constantly. The problem has been unchanged. The pain is located in the Left unilateral and retro-orbital region. The pain does not radiate. The pain quality is not similar to prior headaches. Quality: feels pressure. The pain is moderate. Associated symptoms include phonophobia (mild), photophobia and a visual change (auras). Pertinent negatives include no anorexia, blurred vision, dizziness, ear pain, eye pain, eye redness, eye watering, fever, hearing loss, loss of balance, numbness, scalp tenderness, sinus pressure, tinnitus or weakness. Associated symptoms comments: Auras-flashing in left eye . She has tried acetaminophen and NSAIDs for the symptoms. The treatment provided no relief. Her past medical history is significant for migraine headaches.     Problems: There are no active problems to display for this patient.   Allergies: No Known Allergies Medications:  Current Outpatient Medications:    Calcium Carb-Cholecalciferol (CALCIUM 1000 + D PO), calcium, Disp: , Rfl:    lamoTRIgine (LAMICTAL XR) 50 MG 24 hour tablet, Take 1 tablet every night, Disp: 30 tablet, Rfl: 5  magnesium 30 MG tablet, Take 30 mg by mouth daily., Disp: , Rfl:    Melatonin-Pyridoxine (MELATIN PO), Take by mouth., Disp: , Rfl:    Multiple Vitamins-Minerals (MULTIVITAMIN ADULT EXTRA C PO), multivitamin, Disp: , Rfl:    vitamin B-12 (CYANOCOBALAMIN) 100 MCG tablet, Take 100 mcg by  mouth daily., Disp: , Rfl:   Observations/Objective: Patient is well-developed, well-nourished in no acute distress.  Resting comfortably at home.  Head is normocephalic, atraumatic.  No labored breathing.  Speech is clear and coherent with logical content.  Patient is alert and oriented at baseline.    Assessment and Plan: There are no diagnoses linked to this encounter. - Slightly atypical migraine as she has never had an aura previously - Not responding to Tylenol and Naproxen or caffeine - Has been undergoing work-up for nocturnal seizures and had recent eye exam including dilation of pupil exam that was all normal - Does have a head CT scheduled next month - Add Imitrex for abortive therapy - Strict ER precautions if migraine worsens or visual changes worsen - Seek in person evaluation if not worsening but just not improving with Imitrex for consideration of injectable abortive treatments  Follow Up Instructions: I discussed the assessment and treatment plan with the patient. The patient was provided an opportunity to ask questions and all were answered. The patient agreed with the plan and demonstrated an understanding of the instructions.  A copy of instructions were sent to the patient via MyChart unless otherwise noted below.    The patient was advised to call back or seek an in-person evaluation if the symptoms worsen or if the condition fails to improve as anticipated.    Margaretann Loveless, PA-C

## 2024-01-19 ENCOUNTER — Ambulatory Visit: Payer: BC Managed Care – PPO | Admitting: Neurology

## 2024-02-09 ENCOUNTER — Telehealth: Admitting: Physician Assistant

## 2024-02-09 DIAGNOSIS — S70361A Insect bite (nonvenomous), right thigh, initial encounter: Secondary | ICD-10-CM

## 2024-02-09 DIAGNOSIS — T148XXA Other injury of unspecified body region, initial encounter: Secondary | ICD-10-CM | POA: Diagnosis not present

## 2024-02-09 DIAGNOSIS — W57XXXA Bitten or stung by nonvenomous insect and other nonvenomous arthropods, initial encounter: Secondary | ICD-10-CM | POA: Diagnosis not present

## 2024-02-09 MED ORDER — DOXYCYCLINE HYCLATE 100 MG PO TABS
200.0000 mg | ORAL_TABLET | Freq: Once | ORAL | 0 refills | Status: AC
Start: 2024-02-09 — End: 2024-02-09

## 2024-02-09 NOTE — Progress Notes (Signed)
 Virtual Visit Consent   Julia Macias, you are scheduled for a virtual visit with a [facility] provider today. Just as with appointments in the office, your consent must be obtained to participate. Your consent will be active for this visit and any virtual visit you may have with one of our providers in the next 365 days. If you have a MyChart account, a copy of this consent can be sent to you electronically.  As this is a virtual visit, video technology does not allow for your provider to perform a traditional examination. This may limit your provider's ability to fully assess your condition. If your provider identifies any concerns that need to be evaluated in person or the need to arrange testing (such as labs, EKG, etc.), we will make arrangements to do so. Although advances in technology are sophisticated, we cannot ensure that it will always work on either your end or our end. If the connection with a video visit is poor, the visit may have to be switched to a telephone visit. With either a video or telephone visit, we are not always able to ensure that we have a secure connection.  By engaging in this virtual visit, you consent to the provision of healthcare and authorize for your insurance to be billed (if applicable) for the services provided during this visit. Depending on your insurance coverage, you may receive a charge related to this service.  I need to obtain your verbal consent now. Are you willing to proceed with your visit today? Julia Macias has provided verbal consent on 02/09/2024 for a virtual visit (video or telephone). Angelia Kelp, PA-C  Date: 02/09/2024 8:52 AM   Virtual Visit via Video Note   I, Angelia Kelp, connected with  Julia Macias  ([id number], [birth year]) on 02/09/24 at  8:00 AM EDT by a video-enabled telemedicine application and verified that I am speaking with the correct person using two identifiers.  Location: Patient: Virtual  Visit Location Patient: Home Provider: Virtual Visit Location Provider: Home Office   I discussed the limitations of evaluation and management by telemedicine and the availability of in person appointments. The patient expressed understanding and agreed to proceed.    History of Present Illness: Julia Macias is a 58 y.o. who identifies as a female who was assigned female at birth, and is being seen today for tick bite. Occurred on Sunday. Duration of tick being attached is felt to have been short, less than 24 hours. Tick was reported to have been brown to black and small. Did not keep tick for identification. Was attached to posterior right upper thigh about midway up from knee, slightly medial from midline. Does have some associated soft tissue bruising, which is what prompted the call. Denies fevers, chills, nausea, headache, rash.   Problems: There are no active problems to display for this patient.   Allergies: No Known Allergies Medications:  Current Outpatient Medications:    doxycycline  (VIBRA -TABS) 100 MG tablet, Take 2 tablets (200 mg total) by mouth once for 1 dose., Disp: 2 tablet, Rfl: 0   Calcium Carb-Cholecalciferol (CALCIUM 1000 + D PO), calcium, Disp: , Rfl:    lamoTRIgine  (LAMICTAL  XR) 50 MG 24 hour tablet, Take 1 tablet every night, Disp: 30 tablet, Rfl: 5   magnesium 30 MG tablet, Take 30 mg by mouth daily., Disp: , Rfl:    Melatonin-Pyridoxine (MELATIN PO), Take by mouth., Disp: , Rfl:    Multiple Vitamins-Minerals (MULTIVITAMIN ADULT EXTRA C PO), multivitamin, Disp: ,  Rfl:    SUMAtriptan  (IMITREX ) 50 MG tablet, Take 1 tablet (50 mg total) by mouth every 2 (two) hours as needed for migraine. May repeat in 2 hours if headache persists or recurs., Disp: 10 tablet, Rfl: 0   vitamin B-12 (CYANOCOBALAMIN) 100 MCG tablet, Take 100 mcg by mouth daily., Disp: , Rfl:   Observations/Objective: Patient is well-developed, well-nourished in no acute distress.  Resting  comfortably at home.  Head is normocephalic, atraumatic.  No labored breathing.  Speech is clear and coherent with logical content.  Patient is alert and oriented at baseline.    Assessment and Plan: 1. Bruise (Primary)  2. Tick bite of right thigh, initial encounter - doxycycline (VIBRA-TABS) 100 MG tablet; Take 2 tablets (200 mg total) by mouth once for 1 dose.  Dispense: 2 tablet; Refill: 0  - Will give Doxycycline 200mg  for preventative dose - Keep area clean and dry, avoid picking or scratching - Monitor for any flu-like symptoms over the next 28 days from bite, follow up immediately if any arise  Follow Up Instructions: I discussed the assessment and treatment plan with the patient. The patient was provided an opportunity to ask questions and all were answered. The patient agreed with the plan and demonstrated an understanding of the instructions.  A copy of instructions were sent to the patient via MyChart unless otherwise noted below.    The patient was advised to call back or seek an in-person evaluation if the symptoms worsen or if the condition fails to improve as anticipated.    Angelia Kelp, PA-C

## 2024-02-09 NOTE — Patient Instructions (Signed)
 Julia Macias, thank you for joining Angelia Kelp, PA-C for today's virtual visit.  While this provider is not your primary care provider (PCP), if your PCP is located in our provider database this encounter information will be shared with them immediately following your visit.   A [facility] MyChart account gives you access to today's visit and all your visits, tests, and labs performed at [facility] " click here if you don't have a [facility] MyChart account or go to [email or web address]  Consent: (Patient) Julia Macias provided verbal consent for this virtual visit at the beginning of the encounter.  Current Medications:  Current Outpatient Medications:    doxycycline (VIBRA-TABS) 100 MG tablet, Take 2 tablets (200 mg total) by mouth once for 1 dose., Disp: 2 tablet, Rfl: 0   Calcium Carb-Cholecalciferol (CALCIUM 1000 + D PO), calcium, Disp: , Rfl:    lamoTRIgine  (LAMICTAL  XR) 50 MG 24 hour tablet, Take 1 tablet every night, Disp: 30 tablet, Rfl: 5   magnesium 30 MG tablet, Take 30 mg by mouth daily., Disp: , Rfl:    Melatonin-Pyridoxine (MELATIN PO), Take by mouth., Disp: , Rfl:    Multiple Vitamins-Minerals (MULTIVITAMIN ADULT EXTRA C PO), multivitamin, Disp: , Rfl:    SUMAtriptan  (IMITREX ) 50 MG tablet, Take 1 tablet (50 mg total) by mouth every 2 (two) hours as needed for migraine. May repeat in 2 hours if headache persists or recurs., Disp: 10 tablet, Rfl: 0   vitamin B-12 (CYANOCOBALAMIN) 100 MCG tablet, Take 100 mcg by mouth daily., Disp: , Rfl:    Medications ordered in this encounter:  Meds ordered this encounter  Medications   doxycycline (VIBRA-TABS) 100 MG tablet    Sig: Take 2 tablets (200 mg total) by mouth once for 1 dose.    Dispense:  2 tablet    Refill:  0    Supervising Provider:   LAMPTEY, PHILIP O [[id number]]     *If you need refills on other medications prior to your next appointment, please contact your  pharmacy*  Follow-Up: Call back or seek an in-person evaluation if the symptoms worsen or if the condition fails to improve as anticipated.  [facility] [phone]  Other Instructions  Tick Bite Information, Adult  Ticks are insects that draw blood for food. They climb onto people and animals that brush against the leaves and grasses that they live in. They then bite and attach to the skin. Most ticks are harmless, but some ticks may carry germs that can cause disease. These germs are spread to a person through a bite. To lower your risk of getting a disease from a tick bite, make sure you: Take steps to prevent tick bites. Check for ticks after being outdoors where ticks live. Watch for symptoms of disease if a tick attached to you or if you think a tick bit you. How can I prevent tick bites? Take these steps to help prevent tick bites when you go outdoors in an area where ticks live: Before you go outdoors: Wear long sleeves and long pants to protect your skin from ticks. Wear light-colored clothing so you can see ticks easier. Tuck your pant legs into your socks. Apply insect repellent that has DEET (20% or higher), picaridin, or IR3535 in it to the following areas: Any bare skin. Avoid areas around the eyes and mouth. Edges of clothing, like the top of your boots, the bottom of your pant legs, and your sleeve cuffs. Consider applying an  insect repellant that contains permethrin. Follow the instructions on the label. Do not apply permethrin directly to the skin. Instead, apply to the following areas: Clothing and shoes. Outdoor gear and tents. When you are outdoors: Avoid walking through areas with long grass. If you are walking on a trail, stay in the middle of the trail so your skin, hair, and clothing do not touch the bushes. Check for ticks on your clothing, hair, and skin often while you are outdoors. Check again before you go inside. When you go  indoors: Check your clothing for ticks. Tumble dry clothes in a dryer on high heat for at least 10 minutes. If clothes are damp, additional time may be needed. If clothes require washing, use hot water. Check your gear and pets. Shower soon after being outdoors. Check your body for ticks. Do a full body check using a mirror. Be sure to check your scalp, neck, armpits, waist, groin, and joint areas. These are the spots where ticks attach themselves most often. What is the best way to remove a tick?  Remove the tick as soon as possible. Removing it can prevent germs from passing to your body. Do not remove the tick with your bare fingers. Do not try to remove a tick with heat, alcohol, petroleum jelly, or fingernail polish. These things can cause the tick to salivate and regurgitate into your bloodstream, increasing your risk of getting a disease. To remove a tick that is crawling on your skin: Go outside and brush the tick off. Use tape or a lint roller. To remove a tick that is attached to your skin: Wash your hands. If you have gloves, put them on. Use a fine-tipped tweezer, curved forceps, or a tick-removal tool to gently grasp the tick as close to your skin and the tick's head as possible. Gently pull with a steady, upward, and even pressure until the tick lets go. While removing the tick: Take care to keep the tick's head attached to its body. Do not twist or jerk the tick. This can make the tick's head or mouth parts break off and stay in your skin. If this happens, try to remove the mouth parts with tweezers. If you cannot remove them, leave the area alone and let the skin heal. Do not squeeze or crush the tick's body. This could force disease-carrying fluids from the tick into your body. What should I do after removing a tick? Clean the bite area and your hands with soap and water, rubbing alcohol, or an iodine scrub. If an antiseptic cream or ointment is available, put a small amount  on the bite area. Wash and disinfect any tools that you used to remove the tick. How should I dispose of a tick? To dispose of a live tick, use one of these methods: Place it in rubbing alcohol. Place it in a sealed bag or container, and throw it away. Wrap it tightly in tape, and throw it away. Flush it down the toilet. Where to find more information Centers for Disease Control and Prevention: GyrateAtrophy.si U.S. Environmental Protection Agency: RelocationNetworking.fi Contact a health care provider if: You have symptoms of a disease after a tick bite. Symptoms of a tick-borne disease can occur from moments after the tick bites to 30 days after a tick is removed. Symptoms include: Fever or chills. A red rash that makes a circle (bull's-eye rash) in the bite area. Redness and swelling in the bite area. Headache or stiff neck. Muscle, joint, or bone  pain. Abnormal tiredness. Numbness in your legs or trouble walking or moving your legs. Tender or swollen lymph glands. Abdominal pain, vomiting, diarrhea, or weight loss. Get help right away if: You are not able to remove a tick. You have muscle weakness or paralysis. Your symptoms get worse or you experience new symptoms. You find an engorged tick on your skin and you are in an area where there is a higher risk of disease from ticks. Summary Ticks may carry germs that can spread to a person through a bite. These germs can cause disease. Wear protective clothing and use insect repellent to prevent tick bites. Follow the instructions on the label. If you find a tick on your body, remove it as soon as possible. If the tick is attached, do not try to remove it with heat, alcohol, petroleum jelly, or fingernail polish. If you have symptoms of a disease after being bitten by a tick, contact a health care provider. This information is not intended to replace advice given to you by your health care provider. Make sure you discuss any questions  you have with your health care provider. Document Revised: 11/29/2021 Document Reviewed: 11/29/2021 Elsevier Patient Education  2024 Elsevier [employer].   If you have been instructed to have an in-person evaluation today at a local Urgent Care facility, please use the link below. It will take you to a list of all of our available [facility], including address, phone number and hours of operation. Please do not delay care.  [facility]  If you or a family member do not have a primary care provider, use the link below to schedule a visit and establish care. When you choose a [facility] primary care physician or advanced practice provider, you gain a long-term partner in health. Find a Primary Care Provider  Learn more about [facility]'s in-office and virtual care options: [facility] - Get Care Now

## 2024-02-21 DIAGNOSIS — Z7189 Other specified counseling: Secondary | ICD-10-CM | POA: Diagnosis not present

## 2024-02-21 DIAGNOSIS — L249 Irritant contact dermatitis, unspecified cause: Secondary | ICD-10-CM | POA: Diagnosis not present

## 2025-01-08 ENCOUNTER — Ambulatory Visit: Admitting: Family Medicine
